# Patient Record
Sex: Female | Born: 1990 | Race: White | Hispanic: No | Marital: Married | Smoking: Never smoker
Health system: Southern US, Community
[De-identification: ages and names within clinical notes are randomized; demographics above are authoritative.]

## PROBLEM LIST (undated history)

## (undated) DIAGNOSIS — M1990 Unspecified osteoarthritis, unspecified site: Secondary | ICD-10-CM

## (undated) DIAGNOSIS — F419 Anxiety disorder, unspecified: Secondary | ICD-10-CM

## (undated) HISTORY — PX: WISDOM TOOTH EXTRACTION: SHX21

## (undated) HISTORY — DX: Unspecified osteoarthritis, unspecified site: M19.90

---
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
## 2008-09-09 ENCOUNTER — Emergency Department (HOSPITAL_COMMUNITY): Admission: EM | Admit: 2008-09-09 | Discharge: 2008-09-09 | Payer: Self-pay | Admitting: Emergency Medicine

## 2009-11-28 IMAGING — CR DG FOOT COMPLETE 3+V*R*
3 series · 3 of 3 positions shown · non-contrast
Comparison: None

CLINICAL DATA: Injured foot

RIGHT FOOT COMPLETE - 3+ VIEW

[t foot ap right]
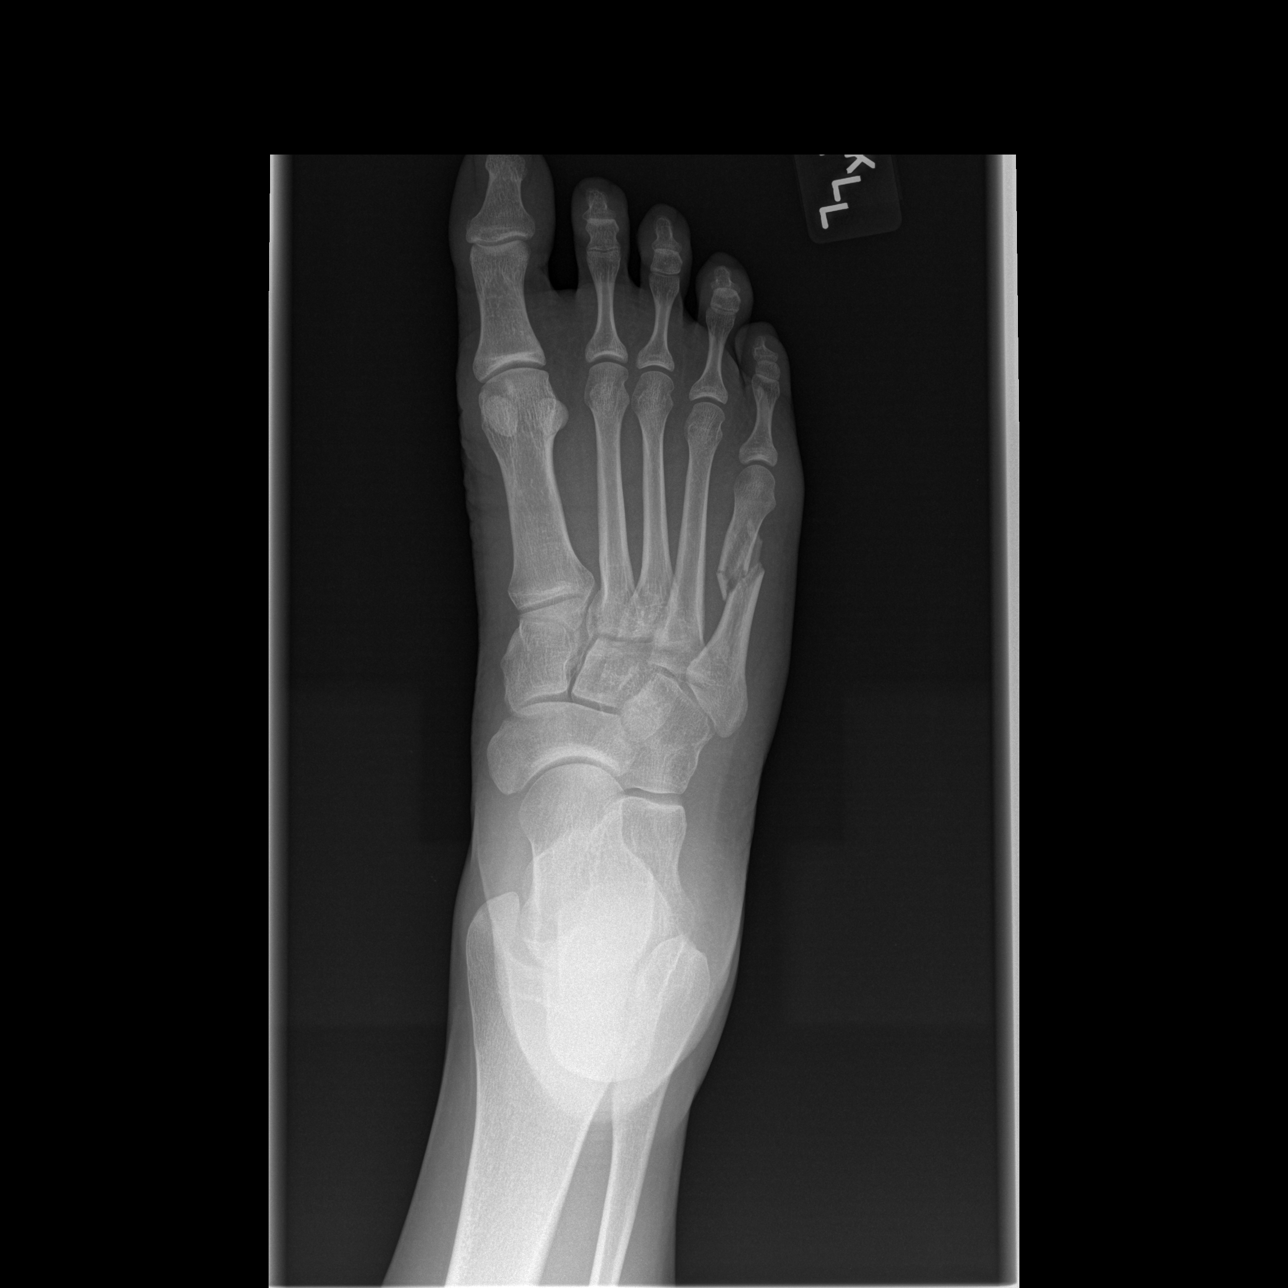

[t foot oblique right]
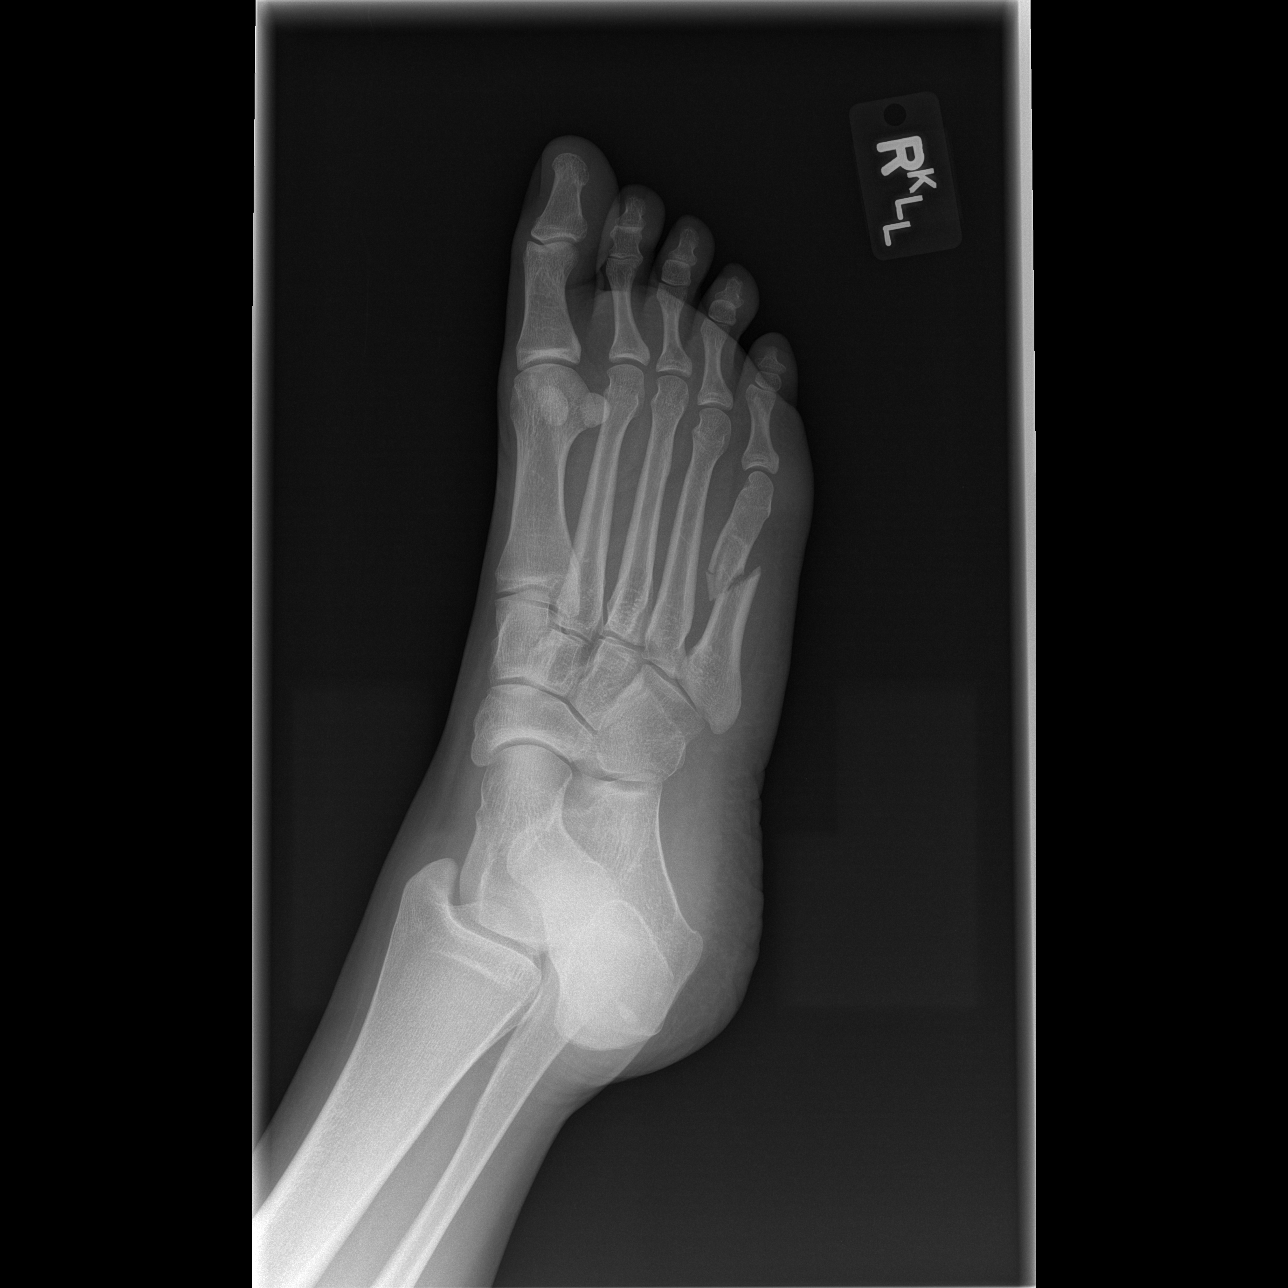

[t foot lat right]
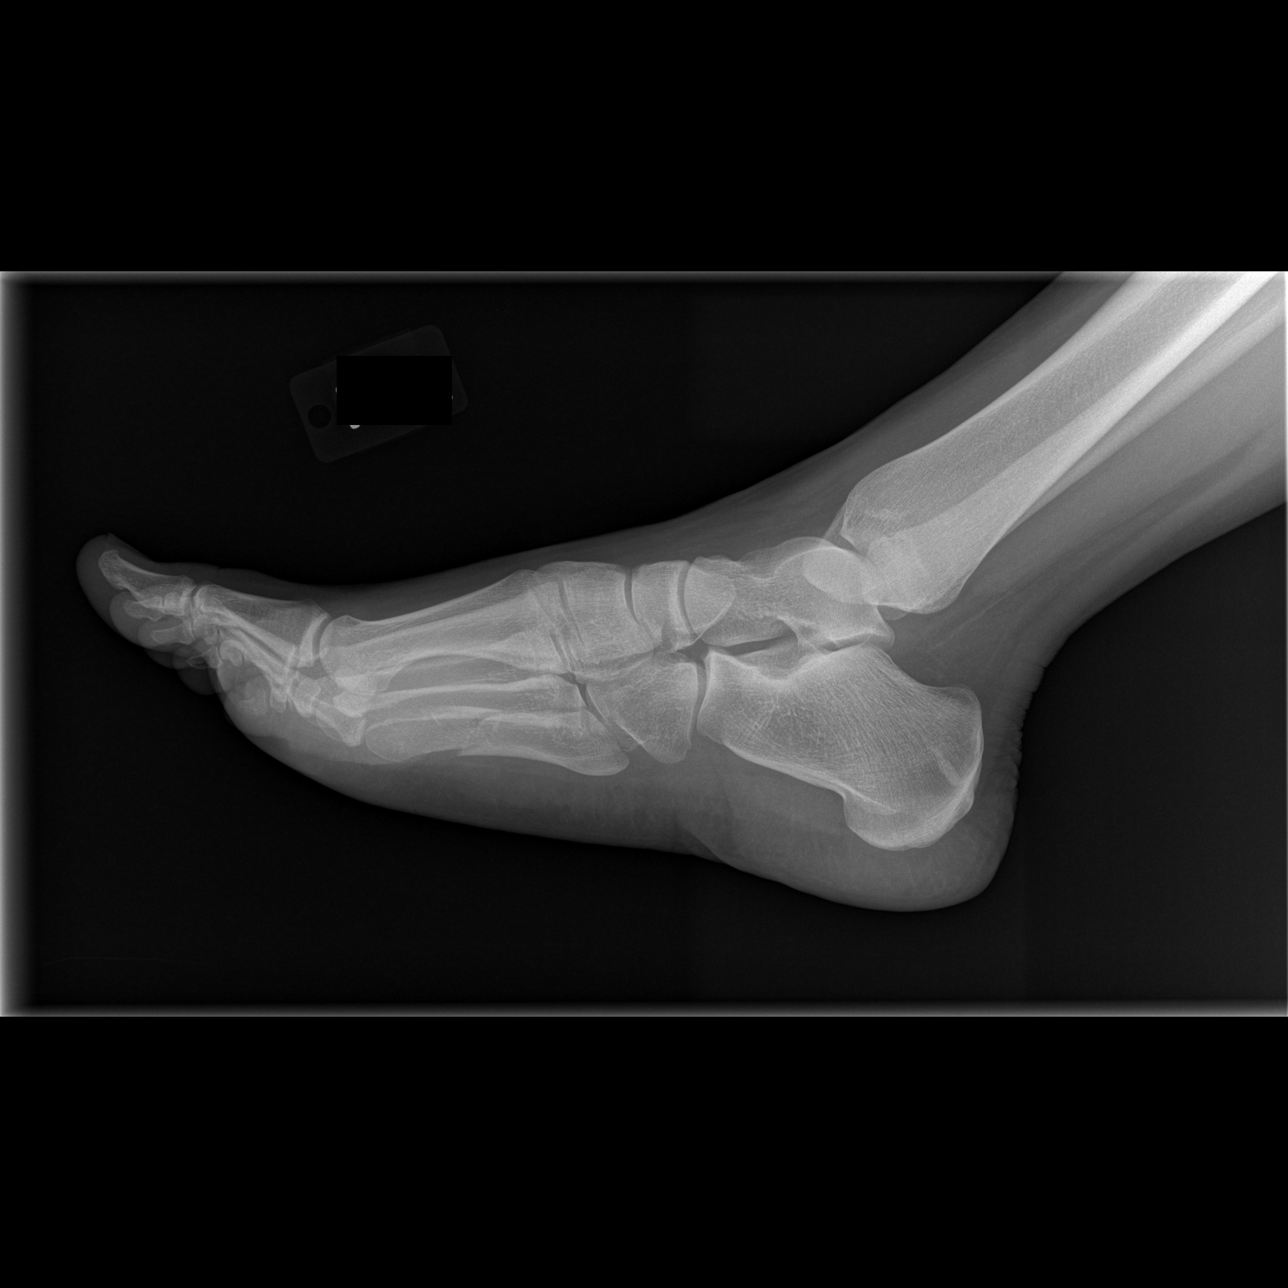

[3 of 3 positions shown; findings below may reference images not displayed]

FINDINGS: There is a displaced and somewhat comminuted fracture of
the fifth metatarsal shaft.  The joint spaces are maintained.  No
other fractures are seen.  There is a benign appearing sclerotic
area in the calcaneus.
IMPRESSION: 1.  Mid shaft fifth metatarsal fracture.

## 2012-05-11 ENCOUNTER — Ambulatory Visit: Payer: 59 | Admitting: Physician Assistant

## 2012-05-11 VITALS — BP 128/72 | HR 113 | Temp 101.8°F | Resp 20 | Ht 69.0 in | Wt 236.0 lb

## 2012-05-11 DIAGNOSIS — J029 Acute pharyngitis, unspecified: Secondary | ICD-10-CM

## 2012-05-11 DIAGNOSIS — M791 Myalgia, unspecified site: Secondary | ICD-10-CM

## 2012-05-11 DIAGNOSIS — R509 Fever, unspecified: Secondary | ICD-10-CM

## 2012-05-11 DIAGNOSIS — J209 Acute bronchitis, unspecified: Secondary | ICD-10-CM

## 2012-05-11 DIAGNOSIS — M069 Rheumatoid arthritis, unspecified: Secondary | ICD-10-CM | POA: Insufficient documentation

## 2012-05-11 LAB — POCT CBC
Granulocyte percent: 88.1 %G — AB (ref 37–80)
HCT, POC: 46.4 % (ref 37.7–47.9)
Lymph, poc: 1 (ref 0.6–3.4)
MCHC: 30.6 g/dL — AB (ref 31.8–35.4)
MCV: 97.1 fL — AB (ref 80–97)
POC LYMPH PERCENT: 7.5 %L — AB (ref 10–50)
RDW, POC: 14.3 %
WBC: 13.5 10*3/uL — AB (ref 4.6–10.2)

## 2012-05-11 LAB — POCT RAPID STREP A (OFFICE): Rapid Strep A Screen: NEGATIVE

## 2012-05-11 MED ORDER — HYDROCODONE-HOMATROPINE 5-1.5 MG/5ML PO SYRP: 5.0000 mL | ORAL_SOLUTION | Freq: Four times a day (QID) | ORAL | Status: AC | PRN

## 2012-05-11 MED ORDER — CEFDINIR 300 MG PO CAPS: 300.0000 mg | ORAL_CAPSULE | Freq: Two times a day (BID) | ORAL | Status: AC

## 2012-05-11 MED ORDER — IBUPROFEN 200 MG PO TABS
400.0000 mg | ORAL_TABLET | Freq: Once | ORAL | Status: AC
  Administered 2012-05-11: 400 mg via ORAL

## 2012-05-11 NOTE — Progress Notes (Signed)
  Subjective:    Patient ID: Eileen Landry, female    DOB: 08/30/1991, 21 y.o.   MRN: [id number]  HPI  Pt presents to clinic with 5 day h/o sore throat with congestion and productive cough with green phlegm.  The cough got worse today and she started to develop a fever today.  She started mucinex this am but unsure if it is helping.  Her cough is increased at night.  She has been around no sick people that she knows of.  She has RA and is immunocompromised due to methotrexate.  No h/o asthma.  Review of Systems  Constitutional: Positive for fever. Negative for chills.  HENT: Positive for congestion (green color), sore throat (getting slightly better in the afternoon), rhinorrhea and postnasal drip. Negative for ear pain.   Respiratory: Positive for cough (green). Negative for chest tightness.   Gastrointestinal: Negative for nausea, vomiting and diarrhea.       Objective:   Physical Exam  Nursing note and vitals reviewed. Constitutional: She is oriented to person, place, and time. She appears well-developed and well-nourished.  HENT:  Head: Normocephalic and atraumatic.  Right Ear: Hearing, tympanic membrane, external ear and ear canal normal.  Left Ear: Hearing, tympanic membrane, external ear and ear canal normal.  Nose: Mucosal edema (red and swollen) present.  Mouth/Throat: Oropharynx is clear and moist.  Eyes: Conjunctivae are normal.  Neck: Normal range of motion.  Cardiovascular: Normal rate, regular rhythm and normal heart sounds.   No murmur heard. Pulmonary/Chest: Effort normal and breath sounds normal. She has no wheezes.  Lymphadenopathy:    She has cervical adenopathy.  Neurological: She is alert and oriented to person, place, and time.  Skin: Skin is warm and dry. She is not diaphoretic.  Psychiatric: She has a normal mood and affect. Her behavior is normal. Judgment and thought content normal.    Results for orders placed in visit on 05/11/12  POCT RAPID STREP A  (OFFICE)      Component Value Range   Rapid Strep A Screen Negative  Negative  POCT CBC      Component Value Range   WBC 13.5 (*) 4.6 - 10.2 K/uL   Lymph, poc 1.0  0.6 - 3.4   POC LYMPH PERCENT 7.5 (*) 10 - 50 %L   MID (cbc) 0.6  0 - 0.9   POC MID % 4.4  0 - 12 %M   POC Granulocyte 11.9 (*) 2 - 6.9   Granulocyte percent 88.1 (*) 37 - 80 %G   RBC 4.78  4.04 - 5.48 M/uL   Hemoglobin 14.2  12.2 - 16.2 g/dL   HCT, POC 40.9  81.1 - 47.9 %   MCV 97.1 (*) 80 - 97 fL   MCH, POC 29.7  27 - 31.2 pg   MCHC 30.6 (*) 31.8 - 35.4 g/dL   RDW, POC 91.4     Platelet Count, POC 319  142 - 424 K/uL   MPV 7.5  0 - 99.8 fL         Assessment & Plan:   1. Fever  ibuprofen (ADVIL,MOTRIN) tablet 400 mg, POCT rapid strep A, POCT CBC, HYDROcodone-homatropine (HYCODAN) 5-1.5 MG/5ML syrup, cefdinir (OMNICEF) 300 MG capsule  2. Rheumatoid arthritis    3. Acute pharyngitis  POCT rapid strep A  4. Myalgia     Cover for bronchitis/sinusitis due to immunocompromised.  Push fluids.  Tylenol prn.

## 2012-05-12 ENCOUNTER — Ambulatory Visit (INDEPENDENT_AMBULATORY_CARE_PROVIDER_SITE_OTHER): Payer: 59 | Admitting: Emergency Medicine

## 2012-05-12 ENCOUNTER — Telehealth: Payer: Self-pay

## 2012-05-12 ENCOUNTER — Ambulatory Visit: Payer: 59

## 2012-05-12 VITALS — BP 120/75 | HR 94 | Temp 99.1°F | Resp 18 | Ht 68.5 in | Wt 237.0 lb

## 2012-05-12 DIAGNOSIS — M545 Low back pain, unspecified: Secondary | ICD-10-CM

## 2012-05-12 MED ORDER — HYDROCODONE-ACETAMINOPHEN 7.5-325 MG PO TABS: 1.0000 | ORAL_TABLET | Freq: Four times a day (QID) | ORAL | Status: AC | PRN

## 2012-05-12 MED ORDER — METAXALONE 800 MG PO TABS: 800.0000 mg | ORAL_TABLET | Freq: Three times a day (TID) | ORAL | Status: AC

## 2012-05-12 MED ORDER — DICLOFENAC SODIUM 75 MG PO TBEC: 75.0000 mg | DELAYED_RELEASE_TABLET | Freq: Two times a day (BID) | ORAL | Status: AC

## 2012-05-12 NOTE — Progress Notes (Signed)
  Subjective:    Patient ID: Eileen Landry, female    DOB: 1991/03/04, 21 y.o.   MRN: [id number]  HPI  Pt presents to clinic with acute back pain that started last PM after she reached down to pick up her medication from a low table.  She felt/heard a pop and had immediate pain.  She had/has no pain radiation and no weakness or paresthesias.  She is having trouble with movement because of the pain in her back.  She had some left over Vicodin that she took this am and it has not helped the pain.  She has never really had trouble with her back before except when she had started her new job and had pain in her upper back and she was started on Flexeril which did not help her at all.  Review of Systems  Musculoskeletal: Positive for back pain (lower) and gait problem.       Objective:   Physical Exam  Constitutional: She is oriented to person, place, and time. She appears well-developed and well-nourished.  HENT:  Head: Normocephalic and atraumatic.  Right Ear: External ear normal.  Left Ear: External ear normal.  Nose: Nose normal.  Pulmonary/Chest: Effort normal.  Musculoskeletal:       Lumbar back: She exhibits decreased range of motion (signifcatly reduced moevement 2nd to pain). She exhibits no tenderness, no bony tenderness and no spasm.       Back:  Neurological: She is alert and oriented to person, place, and time.  Skin: Skin is warm and dry.  Psychiatric: She has a normal mood and affect. Her behavior is normal. Judgment and thought content normal.   [facility] reading (PRIMARY) by  Dr. Cleta Alberts. Neg.          Assessment & Plan:   1. Acute lumbar back pain  DG Lumbar Spine 2-3 Views, HYDROcodone-acetaminophen (NORCO) 7.5-325 MG per tablet, metaxalone (SKELAXIN) 800 MG tablet, diclofenac (VOLTAREN) 75 MG EC tablet   Pt should use ice for the 48h then turn to heat.  Try stretches to help increase mobility.  D/w pt warning signs and when to rtc.

## 2012-05-12 NOTE — Telephone Encounter (Signed)
Pt saw S. Weber today and had questions re: her medications prescribed.

## 2012-05-14 NOTE — Telephone Encounter (Signed)
Spoke with patient, she states to disregard message now, as the medications are working better for her.

## 2012-07-03 ENCOUNTER — Ambulatory Visit (INDEPENDENT_AMBULATORY_CARE_PROVIDER_SITE_OTHER): Payer: 59 | Admitting: Emergency Medicine

## 2012-07-03 VITALS — BP 125/80 | HR 67 | Temp 98.7°F | Resp 16 | Ht 68.5 in | Wt 228.0 lb

## 2012-07-03 DIAGNOSIS — H01009 Unspecified blepharitis unspecified eye, unspecified eyelid: Secondary | ICD-10-CM

## 2012-07-03 MED ORDER — DOXYCYCLINE HYCLATE 100 MG PO TABS: 100.0000 mg | ORAL_TABLET | Freq: Two times a day (BID) | ORAL | Status: AC

## 2012-07-03 MED ORDER — LEVOFLOXACIN 500 MG PO TABS: 750.0000 mg | ORAL_TABLET | Freq: Every day | ORAL | Status: AC

## 2012-07-03 NOTE — Progress Notes (Signed)
   Date:  07/03/2012   Name:  Eileen Landry   DOB:  June 25, 1991   MRN:  [id number] Gender: female Age: 21 y.o.  PCP:  No primary provider on file.    Chief Complaint: Belepharitis   History of Present Illness:  Eileen Landry is a 21 y.o. pleasant patient who presents with the following:  Swollen and droopy left upper lid when she awoke yesterday morning.  No history of trauma or bites.  No fever or chills.  No drainage or gluing.  No pain in globe and no FB sensation. Says that socket and lid have intermittently been painful. Daily contact lens wearer but not wearing today.  No visual complaints.  Patient Active Problem List  Diagnosis  . Rheumatoid arthritis    No past medical history on file.  No past surgical history on file.  History  Substance Use Topics  . Smoking status: Never Smoker   . Smokeless tobacco: Not on file  . Alcohol Use: Not on file    No family history on file.  No Known Allergies  Medication list has been reviewed and updated.  Current Outpatient Prescriptions on File Prior to Visit  Medication Sig Dispense Refill  . folic acid (FOLVITE) 1 MG tablet Take 1 mg by mouth daily.      . medroxyPROGESTERone (DEPO-PROVERA) 150 MG/ML injection Inject 150 mg into the muscle every 3 (three) months.      . methotrexate (RHEUMATREX) 2.5 MG tablet Take 2.5 mg by mouth once a week. Caution:Chemotherapy. Protect from light.      . Multiple Vitamin (MULTIVITAMIN) tablet Take 1 tablet by mouth daily.      . diclofenac (VOLTAREN) 75 MG EC tablet Take 1 tablet (75 mg total) by mouth 2 (two) times daily.  30 tablet  0    Review of Systems:  As per HPI, otherwise negative.    Physical Examination: Filed Vitals:   07/03/12 1322  BP: 125/80  Pulse: 67  Temp: 98.7 F (37.1 C)  Resp: 16   Filed Vitals:   07/03/12 1322  Height: 5' 8.5" (1.74 m)  Weight: 228 lb (103.42 kg)   Body mass index is 34.16 kg/(m^2). Ideal Body Weight: Weight in (lb) to have BMI =  25: 166.5    GEN: WDWN, NAD, Non-toxic, Alert & Oriented x 3 HEENT: Atraumatic, Normocephalic.   Erythematous and swollen left upper lid.  No lesions, drainage.  Globe intact with no scleral injection, FB, or hyphema.  No chemosis. No drainage.  Oropharynx negative Ears and Nose: No external deformity.  TM negative Neck: supple no adenopathy EXTR: No clubbing/cyanosis/edema NEURO: Normal gait.  PSYCH: Normally interactive. Conversant. Not depressed or anxious appearing.  Calm demeanor.    Assessment and Plan: plepharitis Doxycycline levaquin Follow up as needed  Carmelina Dane, MD; Disc antib choices I have reviewed and agree with documentation. Robert P. Merla Riches, M.D.

## 2012-08-28 ENCOUNTER — Ambulatory Visit (INDEPENDENT_AMBULATORY_CARE_PROVIDER_SITE_OTHER): Payer: 59 | Admitting: Family Medicine

## 2012-08-28 VITALS — BP 118/82 | HR 81 | Temp 98.4°F | Resp 18 | Wt 226.0 lb

## 2012-08-28 DIAGNOSIS — H6120 Impacted cerumen, unspecified ear: Secondary | ICD-10-CM

## 2012-08-28 DIAGNOSIS — H9209 Otalgia, unspecified ear: Secondary | ICD-10-CM

## 2012-08-28 NOTE — Progress Notes (Signed)
21 year old woman with blocked ears for one week and progressive pain on the right.  Objective: Bilateral cerumen impaction which was lavaged clean Oropharynx: Normal Neck: Supple no adenopathy Assessment: Resolved cerumen impaction

## 2015-04-08 ENCOUNTER — Encounter (HOSPITAL_COMMUNITY): Payer: Self-pay | Admitting: *Deleted

## 2015-04-08 ENCOUNTER — Other Ambulatory Visit (HOSPITAL_COMMUNITY): Payer: Self-pay | Admitting: Orthopedic Surgery

## 2015-04-09 ENCOUNTER — Ambulatory Visit (HOSPITAL_COMMUNITY): Payer: 59

## 2015-04-09 ENCOUNTER — Ambulatory Visit (HOSPITAL_COMMUNITY): Payer: 59 | Admitting: Anesthesiology

## 2015-04-09 ENCOUNTER — Encounter (HOSPITAL_COMMUNITY)
Admission: RE | Disposition: A | Discharge status: Home / Self Care | Payer: Self-pay | Source: Ambulatory Visit | Attending: Orthopedic Surgery

## 2015-04-09 ENCOUNTER — Observation Stay (HOSPITAL_COMMUNITY): Payer: 59

## 2015-04-09 ENCOUNTER — Encounter (HOSPITAL_COMMUNITY): Payer: Self-pay | Admitting: *Deleted

## 2015-04-09 ENCOUNTER — Observation Stay (HOSPITAL_COMMUNITY)
Admission: RE | Admit: 2015-04-09 | Discharge: 2015-04-10 | Disposition: A | Discharge status: Home / Self Care | Payer: 59 | Source: Ambulatory Visit | Attending: Orthopedic Surgery | Admitting: Orthopedic Surgery

## 2015-04-09 DIAGNOSIS — Y9389 Activity, other specified: Secondary | ICD-10-CM | POA: Insufficient documentation

## 2015-04-09 DIAGNOSIS — F419 Anxiety disorder, unspecified: Secondary | ICD-10-CM | POA: Diagnosis not present

## 2015-04-09 DIAGNOSIS — W19XXXA Unspecified fall, initial encounter: Secondary | ICD-10-CM | POA: Insufficient documentation

## 2015-04-09 DIAGNOSIS — Y9289 Other specified places as the place of occurrence of the external cause: Secondary | ICD-10-CM | POA: Insufficient documentation

## 2015-04-09 DIAGNOSIS — Z881 Allergy status to other antibiotic agents status: Secondary | ICD-10-CM | POA: Diagnosis not present

## 2015-04-09 DIAGNOSIS — S82892A Other fracture of left lower leg, initial encounter for closed fracture: Secondary | ICD-10-CM

## 2015-04-09 DIAGNOSIS — Y998 Other external cause status: Secondary | ICD-10-CM | POA: Diagnosis not present

## 2015-04-09 DIAGNOSIS — S82842A Displaced bimalleolar fracture of left lower leg, initial encounter for closed fracture: Principal | ICD-10-CM | POA: Insufficient documentation

## 2015-04-09 DIAGNOSIS — M1990 Unspecified osteoarthritis, unspecified site: Secondary | ICD-10-CM | POA: Insufficient documentation

## 2015-04-09 DIAGNOSIS — S82899A Other fracture of unspecified lower leg, initial encounter for closed fracture: Secondary | ICD-10-CM | POA: Diagnosis present

## 2015-04-09 HISTORY — DX: Anxiety disorder, unspecified: F41.9

## 2015-04-09 HISTORY — PX: ORIF ANKLE FRACTURE: SHX5408

## 2015-04-09 LAB — CBC
HEMATOCRIT: 41.1 % (ref 36.0–46.0)
Hemoglobin: 13.5 g/dL (ref 12.0–15.0)
MCH: 29.9 pg (ref 26.0–34.0)
MCHC: 32.8 g/dL (ref 30.0–36.0)
MCV: 90.9 fL (ref 78.0–100.0)
Platelets: 270 10*3/uL (ref 150–400)
RBC: 4.52 MIL/uL (ref 3.87–5.11)
RDW: 15.1 % (ref 11.5–15.5)
WBC: 11.7 10*3/uL — AB (ref 4.0–10.5)

## 2015-04-09 LAB — HCG, SERUM, QUALITATIVE: Preg, Serum: NEGATIVE

## 2015-04-09 SURGERY — OPEN REDUCTION INTERNAL FIXATION (ORIF) ANKLE FRACTURE
Anesthesia: General | Site: Ankle | Laterality: Left

## 2015-04-09 MED ORDER — METOCLOPRAMIDE HCL 5 MG PO TABS: 5.0000 mg | ORAL_TABLET | Freq: Three times a day (TID) | ORAL | Status: DC | PRN

## 2015-04-09 MED ORDER — FENTANYL CITRATE (PF) 250 MCG/5ML IJ SOLN
INTRAMUSCULAR | Status: AC
  Filled 2015-04-09: qty 5

## 2015-04-09 MED ORDER — CLINDAMYCIN PHOSPHATE 900 MG/50ML IV SOLN
900.0000 mg | INTRAVENOUS | Status: AC
  Administered 2015-04-09: 900 mg via INTRAVENOUS
  Filled 2015-04-09: qty 50

## 2015-04-09 MED ORDER — FENTANYL CITRATE (PF) 100 MCG/2ML IJ SOLN
100.0000 ug | Freq: Once | INTRAMUSCULAR | Status: AC
  Administered 2015-04-09: 100 ug via INTRAVENOUS

## 2015-04-09 MED ORDER — METHOCARBAMOL 500 MG PO TABS
500.0000 mg | ORAL_TABLET | Freq: Three times a day (TID) | ORAL | Status: DC | PRN
  Administered 2015-04-09 – 2015-04-10 (×3): 500 mg via ORAL
  Filled 2015-04-09 (×4): qty 1

## 2015-04-09 MED ORDER — FENTANYL CITRATE (PF) 100 MCG/2ML IJ SOLN
INTRAMUSCULAR | Status: AC
  Administered 2015-04-09: 100 ug via INTRAVENOUS
  Filled 2015-04-09: qty 2

## 2015-04-09 MED ORDER — LIDOCAINE HCL (CARDIAC) 20 MG/ML IV SOLN
INTRAVENOUS | Status: DC | PRN
  Administered 2015-04-09: 25 mg via INTRAVENOUS

## 2015-04-09 MED ORDER — CLINDAMYCIN PHOSPHATE 600 MG/50ML IV SOLN
600.0000 mg | Freq: Four times a day (QID) | INTRAVENOUS | Status: AC
  Administered 2015-04-10 (×2): 600 mg via INTRAVENOUS
  Filled 2015-04-09 (×3): qty 50

## 2015-04-09 MED ORDER — ONDANSETRON HCL 4 MG/2ML IJ SOLN
INTRAMUSCULAR | Status: DC | PRN
  Administered 2015-04-09: 4 mg via INTRAVENOUS

## 2015-04-09 MED ORDER — FENTANYL CITRATE (PF) 100 MCG/2ML IJ SOLN
INTRAMUSCULAR | Status: DC | PRN
  Administered 2015-04-09 (×10): 50 ug via INTRAVENOUS

## 2015-04-09 MED ORDER — PROPOFOL 10 MG/ML IV BOLUS
INTRAVENOUS | Status: AC
  Filled 2015-04-09: qty 20

## 2015-04-09 MED ORDER — ADULT MULTIVITAMIN W/MINERALS CH
1.0000 | ORAL_TABLET | Freq: Every day | ORAL | Status: DC
  Administered 2015-04-10: 1 via ORAL
  Filled 2015-04-09: qty 1

## 2015-04-09 MED ORDER — OXYCODONE HCL 5 MG PO TABS
ORAL_TABLET | ORAL | Status: AC
  Filled 2015-04-09: qty 1

## 2015-04-09 MED ORDER — PROPOFOL 10 MG/ML IV BOLUS
INTRAVENOUS | Status: DC | PRN
  Administered 2015-04-09: 200 mg via INTRAVENOUS

## 2015-04-09 MED ORDER — ONDANSETRON HCL 4 MG/2ML IJ SOLN: 4.0000 mg | Freq: Once | INTRAMUSCULAR | Status: DC | PRN

## 2015-04-09 MED ORDER — MIDAZOLAM HCL 2 MG/2ML IJ SOLN
INTRAMUSCULAR | Status: AC
  Administered 2015-04-09: 2 mg via INTRAVENOUS
  Filled 2015-04-09: qty 2

## 2015-04-09 MED ORDER — ACETAMINOPHEN 325 MG PO TABS: 650.0000 mg | ORAL_TABLET | Freq: Four times a day (QID) | ORAL | Status: DC | PRN

## 2015-04-09 MED ORDER — RIVAROXABAN 10 MG PO TABS
10.0000 mg | ORAL_TABLET | Freq: Every day | ORAL | Status: DC
  Administered 2015-04-10: 10 mg via ORAL
  Filled 2015-04-09: qty 1

## 2015-04-09 MED ORDER — FENTANYL CITRATE (PF) 100 MCG/2ML IJ SOLN
25.0000 ug | INTRAMUSCULAR | Status: DC | PRN
  Administered 2015-04-09: 25 ug via INTRAVENOUS
  Administered 2015-04-09 (×2): 50 ug via INTRAVENOUS
  Administered 2015-04-09: 25 ug via INTRAVENOUS

## 2015-04-09 MED ORDER — ONDANSETRON HCL 4 MG PO TABS: 4.0000 mg | ORAL_TABLET | Freq: Four times a day (QID) | ORAL | Status: DC | PRN

## 2015-04-09 MED ORDER — FENTANYL CITRATE (PF) 100 MCG/2ML IJ SOLN
INTRAMUSCULAR | Status: AC
  Filled 2015-04-09: qty 2

## 2015-04-09 MED ORDER — MIDAZOLAM HCL 2 MG/2ML IJ SOLN
2.0000 mg | Freq: Once | INTRAMUSCULAR | Status: AC
  Administered 2015-04-09: 2 mg via INTRAVENOUS

## 2015-04-09 MED ORDER — OXYCODONE HCL 5 MG PO TABS
5.0000 mg | ORAL_TABLET | ORAL | Status: DC | PRN
Start: 2015-04-09 — End: 2015-04-10
  Administered 2015-04-10 (×5): 10 mg via ORAL
  Filled 2015-04-09 (×5): qty 2

## 2015-04-09 MED ORDER — MIDAZOLAM HCL 2 MG/2ML IJ SOLN
INTRAMUSCULAR | Status: AC
  Filled 2015-04-09: qty 2

## 2015-04-09 MED ORDER — OXYCODONE HCL 5 MG/5ML PO SOLN: 5.0000 mg | Freq: Once | ORAL | Status: AC | PRN

## 2015-04-09 MED ORDER — METHOCARBAMOL 500 MG PO TABS
ORAL_TABLET | ORAL | Status: AC
  Filled 2015-04-09: qty 1

## 2015-04-09 MED ORDER — LACTATED RINGERS IV SOLN
INTRAVENOUS | Status: DC
  Administered 2015-04-09 (×2): via INTRAVENOUS

## 2015-04-09 MED ORDER — 0.9 % SODIUM CHLORIDE (POUR BTL) OPTIME
TOPICAL | Status: DC | PRN
  Administered 2015-04-09: 1000 mL

## 2015-04-09 MED ORDER — CLONAZEPAM 1 MG PO TABS
1.0000 mg | ORAL_TABLET | Freq: Three times a day (TID) | ORAL | Status: DC | PRN
  Administered 2015-04-10: 1 mg via ORAL
  Filled 2015-04-09: qty 1

## 2015-04-09 MED ORDER — HYDROMORPHONE HCL 1 MG/ML IJ SOLN
1.0000 mg | INTRAMUSCULAR | Status: DC | PRN
  Administered 2015-04-09 – 2015-04-10 (×3): 1 mg via INTRAVENOUS
  Filled 2015-04-09 (×3): qty 1

## 2015-04-09 MED ORDER — ACETAMINOPHEN 650 MG RE SUPP: 650.0000 mg | Freq: Four times a day (QID) | RECTAL | Status: DC | PRN

## 2015-04-09 MED ORDER — ONDANSETRON HCL 4 MG/2ML IJ SOLN: 4.0000 mg | Freq: Four times a day (QID) | INTRAMUSCULAR | Status: DC | PRN

## 2015-04-09 MED ORDER — OXYCODONE HCL 5 MG PO TABS
5.0000 mg | ORAL_TABLET | Freq: Once | ORAL | Status: AC | PRN
  Administered 2015-04-09: 5 mg via ORAL

## 2015-04-09 MED ORDER — PHENYLEPHRINE HCL 10 MG/ML IJ SOLN
INTRAMUSCULAR | Status: DC | PRN
  Administered 2015-04-09: 80 ug via INTRAVENOUS
  Administered 2015-04-09: 40 ug via INTRAVENOUS
  Administered 2015-04-09: 80 ug via INTRAVENOUS

## 2015-04-09 MED ORDER — METOCLOPRAMIDE HCL 5 MG/ML IJ SOLN: 5.0000 mg | Freq: Three times a day (TID) | INTRAMUSCULAR | Status: DC | PRN

## 2015-04-09 SURGICAL SUPPLY — 72 items
BANDAGE ELASTIC 4 VELCRO ST LF (GAUZE/BANDAGES/DRESSINGS) ×3 IMPLANT
BANDAGE ELASTIC 6 VELCRO ST LF (GAUZE/BANDAGES/DRESSINGS) ×3 IMPLANT
BIT DRILL 2.7 QC CANN 155 (BIT) ×2 IMPLANT
BIT DRILL 2.7 QC CANN 155MM (BIT) ×1
BIT DRILL 3.5 QC 155 (BIT) ×2 IMPLANT
BIT DRILL 3.5 QC 155MM (BIT) ×1
BIT DRILL QC 2.7 6.3IN  SHORT (BIT) ×2
BIT DRILL QC 2.7 6.3IN SHORT (BIT) ×1 IMPLANT
BLADE SURG 10 STRL SS (BLADE) IMPLANT
BNDG COHESIVE 6X5 TAN STRL LF (GAUZE/BANDAGES/DRESSINGS) IMPLANT
BNDG ESMARK 4X9 LF (GAUZE/BANDAGES/DRESSINGS) ×3 IMPLANT
BNDG GAUZE ELAST 4 BULKY (GAUZE/BANDAGES/DRESSINGS) ×3 IMPLANT
COVER MAYO STAND STRL (DRAPES) ×3 IMPLANT
COVER SURGICAL LIGHT HANDLE (MISCELLANEOUS) ×3 IMPLANT
CUFF TOURNIQUET SINGLE 34IN LL (TOURNIQUET CUFF) IMPLANT
CUFF TOURNIQUET SINGLE 44IN (TOURNIQUET CUFF) IMPLANT
DRAPE C-ARM 42X72 X-RAY (DRAPES) ×3 IMPLANT
DRAPE INCISE IOBAN 66X45 STRL (DRAPES) ×3 IMPLANT
DRAPE SURG 17X23 STRL (DRAPES) ×3 IMPLANT
DRAPE U-SHAPE 47X51 STRL (DRAPES) ×3 IMPLANT
DRSG PAD ABDOMINAL 8X10 ST (GAUZE/BANDAGES/DRESSINGS) ×3 IMPLANT
DURAPREP 26ML APPLICATOR (WOUND CARE) ×6 IMPLANT
ELECT REM PT RETURN 9FT ADLT (ELECTROSURGICAL) ×3
ELECTRODE REM PT RTRN 9FT ADLT (ELECTROSURGICAL) ×1 IMPLANT
FACESHIELD WRAPAROUND (MASK) ×3 IMPLANT
GAUZE SPONGE 4X4 12PLY STRL (GAUZE/BANDAGES/DRESSINGS) ×3 IMPLANT
GAUZE XEROFORM 5X9 LF (GAUZE/BANDAGES/DRESSINGS) ×3 IMPLANT
GLOVE BIOGEL PI IND STRL 8 (GLOVE) ×2 IMPLANT
GLOVE BIOGEL PI INDICATOR 8 (GLOVE) ×4
GLOVE SURG ORTHO 8.0 STRL STRW (GLOVE) ×6 IMPLANT
GOWN STRL REUS W/ TWL LRG LVL3 (GOWN DISPOSABLE) ×3 IMPLANT
GOWN STRL REUS W/TWL LRG LVL3 (GOWN DISPOSABLE) ×6
GUIDE PIN 1.3 (Pin) ×12 IMPLANT
HANDPIECE INTERPULSE COAX TIP (DISPOSABLE)
KIT BASIN OR (CUSTOM PROCEDURE TRAY) ×3 IMPLANT
KIT ROOM TURNOVER OR (KITS) ×3 IMPLANT
MANIFOLD NEPTUNE II (INSTRUMENTS) ×3 IMPLANT
NEEDLE HYPO 25GX1X1/2 BEV (NEEDLE) IMPLANT
NS IRRIG 1000ML POUR BTL (IV SOLUTION) ×3 IMPLANT
PACK ORTHO EXTREMITY (CUSTOM PROCEDURE TRAY) ×3 IMPLANT
PAD ARMBOARD 7.5X6 YLW CONV (MISCELLANEOUS) ×6 IMPLANT
PAD CAST 4YDX4 CTTN HI CHSV (CAST SUPPLIES) ×1 IMPLANT
PADDING CAST COTTON 4X4 STRL (CAST SUPPLIES) ×2
PADDING CAST COTTON 6X4 STRL (CAST SUPPLIES) ×3 IMPLANT
PIN GUIDE 1.3 (Pin) ×4 IMPLANT
PLATE FIBULA DISTAL 5 HOLE (Plate) ×3 IMPLANT
SCREW CANCELLOUS 5.0X16MM (Screw) ×3 IMPLANT
SCREW CANNULATED 4.1X40 (Screw) ×3 IMPLANT
SCREW LOCK 10X3.5XST NS (Screw) ×1 IMPLANT
SCREW LOCK 12X3.5XST PRLC (Screw) ×2 IMPLANT
SCREW LOCK 3.5X10 (Screw) ×2 IMPLANT
SCREW LOCK 3.5X12 (Screw) ×4 IMPLANT
SCREW NL 3.5X14 (Screw) ×3 IMPLANT
SCREW NON LOCK 3.5X12 (Screw) ×6 IMPLANT
SCREW NON LOCK 3.5X20 (Screw) ×3 IMPLANT
SCREW NONLOCK 3.5X10 (Screw) ×3 IMPLANT
SET HNDPC FAN SPRY TIP SCT (DISPOSABLE) IMPLANT
SPLINT PLASTER CAST XFAST 5X30 (CAST SUPPLIES) ×1 IMPLANT
SPLINT PLASTER XFAST SET 5X30 (CAST SUPPLIES) ×2
STOCKINETTE IMPERVIOUS 9X36 MD (GAUZE/BANDAGES/DRESSINGS) ×3 IMPLANT
SUCTION FRAZIER TIP 10 FR DISP (SUCTIONS) ×3 IMPLANT
SUT ETHILON 3 0 PS 1 (SUTURE) ×12 IMPLANT
SUT VIC AB 2-0 CTB1 (SUTURE) ×6 IMPLANT
SUT VIC AB 3-0 SH 27 (SUTURE) ×4
SUT VIC AB 3-0 SH 27X BRD (SUTURE) ×2 IMPLANT
SYR CONTROL 10ML LL (SYRINGE) IMPLANT
TOWEL OR 17X24 6PK STRL BLUE (TOWEL DISPOSABLE) ×3 IMPLANT
TOWEL OR 17X26 10 PK STRL BLUE (TOWEL DISPOSABLE) ×3 IMPLANT
TUBE CONNECTING 12'X1/4 (SUCTIONS) ×1
TUBE CONNECTING 12X1/4 (SUCTIONS) ×2 IMPLANT
WATER STERILE IRR 1000ML POUR (IV SOLUTION) ×3 IMPLANT
YANKAUER SUCT BULB TIP NO VENT (SUCTIONS) ×3 IMPLANT

## 2015-04-09 NOTE — Transfer of Care (Signed)
Immediate Anesthesia Transfer of Care Note  Patient: Eileen Landry  Procedure(s) Performed: Procedure(s): OPEN REDUCTION INTERNAL FIXATION LEFT ANKLE FRACTURE, BIMALLEOLAR.  (Left)  Patient Location: PACU  Anesthesia Type:General with regional   Level of Consciousness: awake, alert , oriented and patient cooperative  Airway & Oxygen Therapy: Patient Spontanous Breathing and Patient connected to nasal cannula oxygen  Post-op Assessment: Report given to RN, Post -op Vital signs reviewed and stable, Patient moving all extremities and Patient moving all extremities X 4  Post vital signs: Reviewed and stable  Last Vitals:  Filed Vitals:   04/09/15 2200  BP: 134/105  Pulse: 75  Temp: 36.7 C  Resp: 15    Complications: No apparent anesthesia complications

## 2015-04-09 NOTE — Brief Op Note (Signed)
04/09/2015  9:56 PM  PATIENT:  Eileen Landry  24 y.o. female  PRE-OPERATIVE DIAGNOSIS:  LEFT BIMALLEOLAR ANKLE FRACTURE  POST-OPERATIVE DIAGNOSIS:  LEFT BIMALLEOLAR ANKLE FRACTURE  PROCEDURE:  Procedure(s): OPEN REDUCTION INTERNAL FIXATION LEFT ANKLE FRACTURE, BIMALLEOLAR.   SURGEON:  Surgeon(s): Cammy Copa, MD  ASSISTANT: carla bethune rnfa  ANESTHESIA:   general  EBL: 75 ml    Total I/O In: 1000 [I.V.:1000] Out: -   BLOOD ADMINISTERED: none  DRAINS: none   LOCAL MEDICATIONS USED:  none  SPECIMEN:  No Specimen  COUNTS:  YES  TOURNIQUET:  * No tourniquets in log *  DICTATION: .Other Dictation: Dictation Number [id number]  PLAN OF CARE: Discharge to home after PACU  PATIENT DISPOSITION:  PACU - hemodynamically stable

## 2015-04-09 NOTE — Anesthesia Preprocedure Evaluation (Addendum)
Anesthesia Evaluation  Patient identified by MRN, date of birth, ID band Patient awake    Reviewed: Allergy & Precautions, NPO status , Patient's Chart, lab work & pertinent test results  Airway Mallampati: II  TM Distance: >3 FB Neck ROM: Full    Dental  (+) Teeth Intact, Dental Advisory Given   Pulmonary  breath sounds clear to auscultation        Cardiovascular Rhythm:Regular Rate:Normal     Neuro/Psych    GI/Hepatic   Endo/Other    Renal/GU      Musculoskeletal   Abdominal (+) + obese,   Peds  Hematology   Anesthesia Other Findings   Reproductive/Obstetrics                             Anesthesia Physical Anesthesia Plan  ASA: II  Anesthesia Plan: General   Post-op Pain Management:    Induction: Intravenous  Airway Management Planned: LMA  Additional Equipment:   Intra-op Plan:   Post-operative Plan:   Informed Consent: I have reviewed the patients History and Physical, chart, labs and discussed the procedure including the risks, benefits and alternatives for the proposed anesthesia with the patient or authorized representative who has indicated his/her understanding and acceptance.   Dental advisory given  Plan Discussed with: CRNA and Anesthesiologist  Anesthesia Plan Comments: (Plan GA with LMA and popliteal block)        Anesthesia Quick Evaluation

## 2015-04-09 NOTE — Anesthesia Postprocedure Evaluation (Signed)
  Anesthesia Post-op Note  Patient: Eileen Landry  Procedure(s) Performed: Procedure(s): OPEN REDUCTION INTERNAL FIXATION LEFT ANKLE FRACTURE, BIMALLEOLAR.  (Left)  Patient Location: PACU  Anesthesia Type:GA combined with regional for post-op pain  Level of Consciousness: awake, alert , oriented and patient cooperative  Airway and Oxygen Therapy: Patient Spontanous Breathing and Patient connected to nasal cannula oxygen  Post-op Pain: mild  Post-op Assessment: Post-op Vital signs reviewed, Patient's Cardiovascular Status Stable, Respiratory Function Stable, No signs of Nausea or vomiting and Pain level controlled LLE Motor Response: Purposeful movement (able to wiggle toes) LLE Sensation: Numbness (due to block)          Post-op Vital Signs: Reviewed and stable  Last Vitals:  Filed Vitals:   04/09/15 2315  BP: 128/61  Pulse: 81  Temp: 37.1 C  Resp: 12    Complications: No apparent anesthesia complications

## 2015-04-09 NOTE — Anesthesia Procedure Notes (Addendum)
Anesthesia Regional Block:  Popliteal block  Pre-Anesthetic Checklist: ,, timeout performed, Correct Patient, Correct Site, Correct Laterality, Correct Procedure, Correct Position, site marked, Risks and benefits discussed,  Surgical consent,  Pre-op evaluation,  At surgeon's request and post-op pain management  Laterality: Left  Prep: chloraprep       Needles:  Injection technique: Single-shot  Needle Type: Echogenic Stimulator Needle     Needle Length: 9cm 9 cm Needle Gauge: 22 and 22 G    Additional Needles:  Procedures: ultrasound guided (picture in chart) and nerve stimulator Popliteal block Narrative:  Start time: 04/09/2015 5:45 PM End time: 04/09/2015 5:50 PM Injection made incrementally with aspirations every 5 mL.  Performed by: Personally   Additional Notes: 30 cc 0.5% bupivacaine with 1:200 epi injected easily   Procedure Name: LMA Insertion Date/Time: 04/09/2015 7:39 PM Performed by: Adonis Housekeeper Pre-anesthesia Checklist: Patient identified, Emergency Drugs available, Suction available and Patient being monitored Patient Re-evaluated:Patient Re-evaluated prior to inductionOxygen Delivery Method: Circle system utilized Preoxygenation: Pre-oxygenation with 100% oxygen Intubation Type: IV induction Ventilation: Mask ventilation without difficulty LMA Size: 4.0 Number of attempts: 1 Placement Confirmation: positive ETCO2 and breath sounds checked- equal and bilateral Tube secured with: Tape Dental Injury: Teeth and Oropharynx as per pre-operative assessment

## 2015-04-09 NOTE — Progress Notes (Signed)
Report to C Moseley RN as primary caregiver 

## 2015-04-09 NOTE — H&P (Signed)
Eileen Landry is an 24 y.o. female.   Chief Complaint: Left ankle pain HPI: Eileen Landry is a 25 year old female with left ankle pain. She sustained an injury several days ago while she was on honeymoon in [place] country "she reports pain with"bimalleolar ankle fracture. There is no calf tenderness. No family history of DVT or pulmonary embolism.  Past Medical History  Diagnosis Date  . Arthritis   . Anxiety     Past Surgical History  Procedure Laterality Date  . Fracture surgery Right 2009    foot  . Wisdom tooth extraction      History reviewed. No pertinent family history. Social History:  reports that she has never smoked. She does not have any smokeless tobacco history on file. She reports that she drinks about 1.2 oz of alcohol per week. She reports that she does not use illicit drugs.  Allergies:  Allergies  Allergen Reactions  . Amoxicillin Rash    No prescriptions prior to admission    No results found for this or any previous visit (from the past 48 hour(s)). No results found.  Review of Systems  Constitutional: Negative.   HENT: Negative.   Eyes: Negative.   Respiratory: Negative.   Cardiovascular: Negative.   Gastrointestinal: Negative.   Genitourinary: Negative.   Musculoskeletal: Positive for joint pain.  Skin: Negative.   Neurological: Negative.   Endo/Heme/Allergies: Negative.   Psychiatric/Behavioral: Negative.     There were no vitals taken for this visit. Physical Exam  Constitutional: She appears well-developed.  HENT:  Head: Normocephalic.  Eyes: Pupils are equal, round, and reactive to light.  Neck: Normal range of motion.  Cardiovascular: Normal rate.   Respiratory: Effort normal.  Neurological: She is alert.  Skin: Skin is warm.  Psychiatric: She has a normal mood and affect.   left ankle demonstrates swelling medially and laterally palpable pedal pulse intact sensation dorsum plantar aspect of the foot she has no calf  tenderness no knee effusion no groin pain with internal extra rotation of the left hand side   Assessment/Plan Impression is bimalleolar ankle fracture on the left plan open reduction internal fixation risk and benefits discussed with the patient including but limited to infection or vessel damage need for hardware removal. Plan rehabilitative schedule also discussed with the patient. Patient understands the risk and benefits all questions answered plan for surgery as an outpatient tomorrow. Pain prescriptions and anticoagulation prescriptions given in the clinic  DEAN,GREGORY SCOTT 04/09/2015, 10:14 AM

## 2015-04-10 ENCOUNTER — Encounter (HOSPITAL_COMMUNITY): Payer: Self-pay | Admitting: General Practice

## 2015-04-10 DIAGNOSIS — S82842A Displaced bimalleolar fracture of left lower leg, initial encounter for closed fracture: Secondary | ICD-10-CM | POA: Diagnosis not present

## 2015-04-10 NOTE — Op Note (Signed)
NAMEMarland Kitchen  Eileen, Landry NO.:  [id number]  MEDICAL RECORD NO.:  [id number]  LOCATION:  5N10C                        FACILITY:  [facility]  PHYSICIAN:  Burnard Bunting, M.D.    DATE OF BIRTH:  23-Jan-1991  DATE OF PROCEDURE: DATE OF DISCHARGE:  04/10/2015                              OPERATIVE REPORT   PREOPERATIVE DIAGNOSIS:  Left trimalleolar ankle fracture.  POSTOPERATIVE DIAGNOSIS:  Left trimalleolar ankle fracture.  PROCEDURE:  Left bimalleolar ankle fracture open reduction and internal fixation.  SURGEON:  Burnard Bunting, M.D.  ASSISTANT:  Patrick Jupiter, RNFA.  INDICATIONS:  Eileen Landry is a 25 year old patient with left ankle fracture presents for operative management after explanation of risks and benefits of fixing the unstable ankle fracture.  PROCEDURE IN DETAIL:  The patient was brought to operating room where general anesthetic was induced.  Preoperative antibiotics were administered.  Time-out was called.  Left leg was prescrubbed with alcohol and Betadine, then allowed to dry.  Prepped with DuraPrep solution and draped in sterile manner.  Time-out was called.  Preop antibiotics were given.  Collier Flowers was used to cover the operative field. Ankle Esmarch utilized for approximately an hour and 15 minutes. Lateral approach to the ankle was made.  Skin and subcutaneous tissue were sharply divided.  Care was taken to avoid injury to superficial peroneal nerve.  Fracture was identified and periosteum elevated from around the fracture. Fracture was reduced and held with a tenaculum. Lag screw placed anterior proximal to posterior distal.  A 5 hole plate then applied Smith and Nephew.  Good reduction was achieved.  Correct location confirmed in the AP and lateral planes under fluoroscopy. Attention was then directed medially.  Fracture location was localized under fluoroscopy.  Incision made.  Care was taken to avoid injury to saphenous nerve and vein.  The  fracture was a small piece.  It was only big enough to accept 1 screw.  The fracture was somewhat difficult to reduce because of rotational instability.  Namelessness, fracture was reduced anatomically and held in position with 2 K-wires, 1 of which was used to facilitate placement of a cannulated 4-0 screw.  This gave good reduction and syndesmotic stability was then tested.  With the ankle plantar flexed, there was more medial and lateral instability, however, she did have a prior ankle injury with ossicle formation.  With the ankle dorsiflexed, there was no syndesmotic instability.  At this time, ankle Esmarch released and thorough irrigation performed of both incisions. Both incisions were then closed using either 2-0 or 3-0 Vicryl and 3-0 nylon. A well-padded posterior splint applied. The patient tolerated the procedure without immediate complication.     Burnard Bunting, M.D.     GSD/MEDQ  D:  04/09/2015  T:  04/10/2015  Job:  [id number]

## 2015-04-10 NOTE — Progress Notes (Signed)
Subjective: Pt stable but having a lot of pain   Objective: Vital signs in last 24 hours: Temp:  [98.1 F (36.7 C)-99.1 F (37.3 C)] 99.1 F (37.3 C) (06/15 0610) Pulse Rate:  [55-102] 96 (06/15 0610) Resp:  [10-32] 14 (06/15 0610) BP: (115-139)/(54-105) 120/54 mmHg (06/15 0610) SpO2:  [95 %-100 %] 95 % (06/15 0610) Weight:  [108.863 kg (240 lb)] 108.863 kg (240 lb) (06/14 1522)  Intake/Output from previous day: 06/14 0701 - 06/15 0700 In: 1910 [P.O.:360; I.V.:1500; IV Piggyback:50] Out: 50 [Blood:50] Intake/Output this shift:    Exam:  toe df pf ok - still with dorsal paresthesias - foot perfused left  Labs:  Recent Labs  04/09/15 1523  HGB 13.5    Recent Labs  04/09/15 1523  WBC 11.7*  RBC 4.52  HCT 41.1  PLT 270   No results for input(s): NA, K, CL, CO2, BUN, CREATININE, GLUCOSE, CALCIUM in the last 72 hours. No results for input(s): LABPT, INR in the last 72 hours.  Assessment/Plan: She has not been up today - will need PT eval and possibly one more night in hospital - for pain control - toe color wnl   Nathalie Cavendish SCOTT 04/10/2015, 7:45 AM

## 2015-04-10 NOTE — Progress Notes (Signed)
Physical Therapy Treatment Patient Details Name: Eileen Landry MRN: [id number] DOB: [birth year] Today's Date: 04/10/2015    History of Present Illness pt presents with L ankle fx s/p ORIF.    PT Comments    Pt demonstrated ability to ambulate 80 ft and ascend/descend 2 steps this session.  Pt required demonstration and VCs for stair training technique.  Pt is making good progress w/ PT.  Pt will benefit from continued skilled PT services to increase functional independence and safety.   Follow Up Recommendations  Home health PT;Supervision/Assistance - 24 hour     Equipment Recommendations  Rolling walker with 5" wheels;3in1 (PT)    Recommendations for Other Services       Precautions / Restrictions Precautions Precautions: Fall Restrictions Weight Bearing Restrictions: Yes LLE Weight Bearing: Non weight bearing    Mobility  Bed Mobility Overal bed mobility: Modified Independent             General bed mobility comments: Increased time.  HOB flat and no use of bed rails.  Transfers Overall transfer level: Needs assistance Equipment used: Rolling walker (2 wheeled) Transfers: Sit to/from Stand Sit to Stand: Supervision         General transfer comment: Reminder of NWB status.  Supervision for safety.  Ambulation/Gait Ambulation/Gait assistance: Supervision Ambulation Distance (Feet): 80 Feet Assistive device: Rolling walker (2 wheeled) Gait Pattern/deviations: Step-to pattern;Antalgic   Gait velocity interpretation: Below normal speed for age/gender General Gait Details: Cues to not rush and ensure that she is steady prior to taking each step.     Stairs Stairs: Yes Stairs assistance: Min assist Stair Management: Two rails;Backwards;With walker Number of Stairs: 2 General stair comments: Pt uses RW to assist up to first step and then uses B rails to hop up to 2nd step.  Demonstration and VCs for technique.  Wheelchair Mobility    Modified Rankin  (Stroke Patients Only)       Balance Overall balance assessment: Needs assistance Sitting-balance support: No upper extremity supported;Feet supported Sitting balance-Leahy Scale: Good     Standing balance support: Bilateral upper extremity supported;During functional activity Standing balance-Leahy Scale: Poor                      Cognition Arousal/Alertness: Awake/alert Behavior During Therapy: WFL for tasks assessed/performed Overall Cognitive Status: Within Functional Limits for tasks assessed                      Exercises      General Comments        Pertinent Vitals/Pain Pain Assessment: 0-10 Pain Score: 4  Pain Location: L ankle when not elevated Pain Descriptors / Indicators: Throbbing Pain Intervention(s): Limited activity within patient's tolerance;Monitored during session;Repositioned    Home Living Family/patient expects to be discharged to:: Private residence Living Arrangements: Spouse/significant other Available Help at Discharge: Family;Available PRN/intermittently Type of Home: House Home Access: Stairs to enter Entrance Stairs-Rails: Right;Left;Can reach both Home Layout: Two level;1/2 bath on main level (pt can sleep on couch until ready to do stairs.) Home Equipment: Crutches      Prior Function Level of Independence: Independent with assistive device(s)      Comments: pt indicates using crutches since injuring L ankle, but admits that she was putting weight through LE and not well balanced.     PT Goals (current goals can now be found in the care plan section) Acute Rehab PT Goals Patient Stated Goal: Back to normal.  PT Goal Formulation: With patient Time For Goal Achievement: 04/17/15 Potential to Achieve Goals: Good Progress towards PT goals: Progressing toward goals    Frequency  Min 5X/week    PT Plan Current plan remains appropriate    Co-evaluation             End of Session Equipment Utilized During  Treatment: Gait belt Activity Tolerance: Patient tolerated treatment well;Patient limited by fatigue Patient left: in chair;with call bell/phone within reach;with family/visitor present     Time: 8657-8469 PT Time Calculation (min) (ACUTE ONLY): 13 min  Charges:  $Gait Training: 8-22 mins                    G Codes:  Functional Assessment Tool Used: Clinical Judgement Functional Limitation: Mobility: Walking and moving around Mobility: Walking and Moving Around Current Status (G2952): At least 1 percent but less than 20 percent impaired, limited or restricted Mobility: Walking and Moving Around Goal Status [id number]): 0 percent impaired, limited or restricted   Michail Jewels PT, DPT [phone] Pager: [phone] 04/10/2015, 4:42 PM

## 2015-04-10 NOTE — Progress Notes (Signed)
Pt feeling better ok for dc Did well with PT

## 2015-04-10 NOTE — Progress Notes (Signed)
I received call from pt. And she was in tears asking for pain medication at about 0800, I administered 1 mg prn Dilaudid at that time for breakthrough pain and advised her I would bring her Oxy IR after another 1 hour had passed since last time given was prior to midnight and since midnight. Pt. Received 10 mg Oxy IR at about 0934 and was rating her pain now down to an 8 from a 10. 30 minutes later, pt. Called out asking for her IV Dilaudid pain medication. I advised pt. I would bring it as soon as I could and I advised my charge nurse of the situation with the pt.'s pain. My charge nurse stepped in to help pt.

## 2015-04-10 NOTE — Care Management Note (Signed)
Case Management Note  Patient Details  Name: Eileen Landry MRN: [id number] Date of Birth: [birth year]  Subjective/Objective:          S/p left ankle ORIF          Action/Plan:   Spoke with patient about HH, she chose [facility]. Contacted Miranda at [facility] and set up HHPT. Contacted James with [facility] and requested rolling walker be delivered to patient's room. Patient declined 3N1.    Expected Discharge Date:                  Expected Discharge Plan:  Home w Home Health Services  In-House Referral:  NA  Discharge planning Services  CM Consult  Post Acute Care Choice:  Durable Medical Equipment, Home Health Choice offered to:  Patient  DME Arranged:  Walker rolling DME Agency:  [facility]  HH Arranged:  PT [facility] Agency:  [facility]  Status of Service:  Completed, signed off  Medicare Important Message Given:    Date Medicare IM Given:    Medicare IM give by:    Date Additional Medicare IM Given:    Additional Medicare Important Message give by:     If discussed at Long Length of Stay Meetings, dates discussed:    Additional Comments:  Monica Becton, RN 04/10/2015, 3:53 PM

## 2015-04-10 NOTE — Progress Notes (Signed)
Physical Therapy Treatment Patient Details Name: Eileen Landry MRN: [id number] DOB: 07-Jun-1991 Today's Date: 04/10/2015    History of Present Illness pt presents with L ankle fx s/p ORIF.    PT Comments    Pt mobility limited by pain and difficulties with balance.  Pt states she has crutches at home, but wasn't very steady on them and had to put weight on her L LE.  Pt continues to need cueing for NWBing with RW use.  Pt inquired about knee walker, so PT brought to pt's room to demonstrate.  At this time feel pt is not ready for knee scooter and would benefit from HHPT to work on balance and mobility.  Will continue to follow.    Follow Up Recommendations  Home health PT;Supervision/Assistance - 24 hour     Equipment Recommendations  Rolling walker with 5" wheels;3in1 (PT)    Recommendations for Other Services       Precautions / Restrictions Precautions Precautions: Fall Restrictions Weight Bearing Restrictions: Yes LLE Weight Bearing: Non weight bearing    Mobility  Bed Mobility Overal bed mobility: Modified Independent                Transfers Overall transfer level: Needs assistance Equipment used: Rolling walker (2 wheeled) Transfers: Sit to/from Stand Sit to Stand: Supervision         General transfer comment: cues for UE use.    Ambulation/Gait Ambulation/Gait assistance: Min guard Ambulation Distance (Feet): 50 Feet Assistive device: Rolling walker (2 wheeled) Gait Pattern/deviations: Step-to pattern     General Gait Details: cues for RW use and safety.  pt mildly unsteady with RW and needs cueing to maintain NWBing.     Stairs            Wheelchair Mobility    Modified Rankin (Stroke Patients Only)       Balance Overall balance assessment: Needs assistance Sitting-balance support: No upper extremity supported;Feet supported Sitting balance-Leahy Scale: Good     Standing balance support: Bilateral upper extremity  supported;During functional activity Standing balance-Leahy Scale: Poor                      Cognition Arousal/Alertness: Awake/alert Behavior During Therapy: WFL for tasks assessed/performed Overall Cognitive Status: Within Functional Limits for tasks assessed                      Exercises      General Comments        Pertinent Vitals/Pain Pain Assessment: 0-10 Pain Score: 8  Pain Location: L ankle during mobility Pain Descriptors / Indicators: Sore;Throbbing Pain Intervention(s): Monitored during session;Premedicated before session;Repositioned    Home Living Family/patient expects to be discharged to:: Private residence Living Arrangements: Spouse/significant other Available Help at Discharge: Family;Available PRN/intermittently Type of Home: House Home Access: Stairs to enter Entrance Stairs-Rails: Right;Left;Can reach both Home Layout: Two level;1/2 bath on main level (pt can sleep on couch until ready to do stairs.) Home Equipment: Crutches      Prior Function Level of Independence: Independent with assistive device(s)      Comments: pt indicates using crutches since injuring L ankle, but admits that she was putting weight through LE and not well balanced.     PT Goals (current goals can now be found in the care plan section) Acute Rehab PT Goals Patient Stated Goal: Back to normal. PT Goal Formulation: With patient Time For Goal Achievement: 04/17/15 Potential to Achieve Goals:  Good    Frequency  Min 5X/week    PT Plan      Co-evaluation             End of Session Equipment Utilized During Treatment: Gait belt Activity Tolerance: Patient limited by pain;Patient limited by fatigue Patient left: in bed;with call bell/phone within reach;with family/visitor present     Time: 1347-1416 PT Time Calculation (min) (ACUTE ONLY): 29 min  Charges:  $Gait Training: 8-22 mins                    G Codes:  Functional Assessment Tool  Used: Clinical Judgement Functional Limitation: Mobility: Walking and moving around Mobility: Walking and Moving Around Current Status [phone]): At least 1 percent but less than 20 percent impaired, limited or restricted Mobility: Walking and Moving Around Goal Status [phone]): 0 percent impaired, limited or restricted   Sunny Schlein, [place] [phone] 04/10/2015, 2:50 PM

## 2015-04-10 NOTE — Discharge Instructions (Signed)
Information on my medicine - XARELTO® (Rivaroxaban) ° °This medication education was reviewed with me or my healthcare representative as part of my discharge preparation.   °Why was Xarelto® prescribed for you? °Xarelto® was prescribed for you to reduce the risk of blood clots forming after orthopedic surgery. The medical term for these abnormal blood clots is venous thromboembolism (VTE). ° °What do you need to know about xarelto® ? °Take your Xarelto® 10 mg ONCE DAILY at the same time every day. °You may take it either with or without food. ° °If you have difficulty swallowing the tablet whole, you may crush it and mix in applesauce just prior to taking your dose. ° °Take Xarelto® exactly as prescribed by your doctor and DO NOT stop taking Xarelto® without talking to the doctor who prescribed the medication.  Stopping without other VTE prevention medication to take the place of Xarelto® may increase your risk of developing a clot. ° °After discharge, you should have regular check-up appointments with your healthcare provider that is prescribing your Xarelto®.   ° °What do you do if you miss a dose? °If you miss a dose, take it as soon as you remember on the same day then continue your regularly scheduled once daily regimen the next day. Do not take two doses of Xarelto® on the same day.  ° °Important Safety Information °A possible side effect of Xarelto® is bleeding. You should call your healthcare provider right away if you experience any of the following: °  Bleeding from an injury or your nose that does not stop. °  Unusual colored urine (red or dark brown) or unusual colored stools (red or black). °  Unusual bruising for unknown reasons. °  A serious fall or if you hit your head (even if there is no bleeding). ° °Some medicines may interact with Xarelto® and might increase your risk of bleeding while on Xarelto®. To help avoid this, consult your healthcare provider or pharmacist prior to using any new  prescription or non-prescription medications, including herbals, vitamins, non-steroidal anti-inflammatory drugs (NSAIDs) and supplements. ° °This website has more information on Xarelto®: www.xarelto.com. ° ° ° °

## 2015-05-01 NOTE — Discharge Summary (Signed)
Physician Discharge Summary  Patient ID: Eileen Landry MRN: [id number] DOB/AGE: [birth year] 26 y.o.  Admit date: 2015/04/16 Discharge date: 04/10/2015  Admission Diagnoses:  Active Problems:   Ankle fracture   Discharge Diagnoses:  Same  Surgeries: Procedure(s): OPEN REDUCTION INTERNAL FIXATION LEFT ANKLE FRACTURE, BIMALLEOLAR.  on 04-16-2015   Consultants:    Discharged Condition: Stable  Hospital Course: Eileen Landry is an 24 y.o. female who was admitted 04/16/2015 with a chief complaint of ankle pain, and found to have a diagnosis of ankle fracture.  They were brought to the operating room on 2015/04/16 and underwent the above named procedures.  She tolerated the procedure well and was transferred to home in good condition nwb in splint on xarelto.  Antibiotics given:  Anti-infectives    Start     Dose/Rate Route Frequency Ordered Stop   04/10/15 0200  clindamycin (CLEOCIN) IVPB 600 mg     600 mg 100 mL/hr over 30 Minutes Intravenous Every 6 hours 04/16/15 2344 04/10/15 1124   04/16/2015 2000  clindamycin (CLEOCIN) IVPB 900 mg     900 mg 100 mL/hr over 30 Minutes Intravenous STAT 04-16-15 1946 April 16, 2015 2018    .  Recent vital signs:  Filed Vitals:   04/10/15 1337  BP: 102/48  Pulse: 96  Temp: 98.7 F (37.1 C)  Resp: 16    Recent laboratory studies:  Results for orders placed or performed during the hospital encounter of April 16, 2015  CBC  Result Value Ref Range   WBC 11.7 (H) 4.0 - 10.5 K/uL   RBC 4.52 3.87 - 5.11 MIL/uL   Hemoglobin 13.5 12.0 - 15.0 g/dL   HCT 40.9 81.1 - 91.4 %   MCV 90.9 78.0 - 100.0 fL   MCH 29.9 26.0 - 34.0 pg   MCHC 32.8 30.0 - 36.0 g/dL   RDW 78.2 95.6 - 21.3 %   Platelets 270 150 - 400 K/uL  hCG, serum, qualitative  Result Value Ref Range   Preg, Serum NEGATIVE NEGATIVE    Discharge Medications:     Medication List    TAKE these medications        BESIVANCE 0.6 % Susp  Generic drug:  Besifloxacin HCl  INSTILL 1 DROP IN EACH  EYE 3 TIMES A DAY STARTING 2 DAYS PRIOR TO PROCEDURE & 7 DAYS AFTER     clonazePAM 1 MG tablet  Commonly known as:  KLONOPIN  TAKE 1/2 TO 1 TABLET EVERY 12 HOURS AS NEEDED FOR ANXIETY     methocarbamol 500 MG tablet  Commonly known as:  ROBAXIN  Take 500 mg by mouth every 8 (eight) hours as needed for muscle spasms.     multivitamin with minerals Tabs tablet  Take 1 tablet by mouth daily.     NEXPLANON 68 MG Impl implant  Generic drug:  etonogestrel  1 each by Subdermal route once. Implanted December 2013     oxyCODONE 15 MG immediate release tablet  Commonly known as:  ROXICODONE  Take 10 mg by mouth every 6 (six) hours as needed for pain.     prednisoLONE acetate 1 % ophthalmic suspension  Commonly known as:  PRED FORTE  INSTILL ONE (1) DROP IN EACH EYE 3 TIMES DAY FOR 7 DAYS POSTOPERATIVELY        Diagnostic Studies: Dg Ankle Complete Left  04/16/15   CLINICAL DATA:  ORIF of LEFT ankle fracture.  EXAM: DG C-ARM 61-120 MIN; LEFT ANKLE COMPLETE - 3+ VIEW  COMPARISON:  None.  FINDINGS: Bimalleolar fracture ORIF is present with fibular interfragmentary screw. The ankle mortise is congruent and has been restored. Five intraoperative fluoroscopic spot films of the LEFT ankle are submitted for interpretation.  IMPRESSION: Bimalleolar ORIF of the LEFT ankle.   Electronically Signed   By: Andreas NewportGeoffrey  Lamke M.D.   On: 04/09/2015 22:33   Dg Ankle Left Port  04/10/2015   CLINICAL DATA:  Status post internal fixation of left ankle fracture. Initial encounter.  EXAM: PORTABLE LEFT ANKLE - 2 VIEW  COMPARISON:  Left ankle intraoperative films performed earlier today at 7:59 p.m.  FINDINGS: A screw is noted along the medial malleolus, and a plate and screws are seen along the distal fibula, transfixing the corresponding fractures in grossly anatomic alignment. No new fractures are seen. The ankle mortise is difficult to fully assess given limitations in positioning. The soft tissues are not well  characterized due to the overlying splint.  IMPRESSION: Status post internal fixation of medial malleolar and distal fibular fractures in grossly anatomic alignment.   Electronically Signed   By: Roanna RaiderJeffery  Chang M.D.   On: 04/10/2015 02:38   Dg C-arm 61-120 Min  04/09/2015   CLINICAL DATA:  ORIF of LEFT ankle fracture.  EXAM: DG C-ARM 61-120 MIN; LEFT ANKLE COMPLETE - 3+ VIEW  COMPARISON:  None.  FINDINGS: Bimalleolar fracture ORIF is present with fibular interfragmentary screw. The ankle mortise is congruent and has been restored. Five intraoperative fluoroscopic spot films of the LEFT ankle are submitted for interpretation.  IMPRESSION: Bimalleolar ORIF of the LEFT ankle.   Electronically Signed   By: Andreas NewportGeoffrey  Lamke M.D.   On: 04/09/2015 22:33    Disposition: 01-Home or Self Care      Discharge Instructions    Call MD / Call 911    Complete by:  As directed   If you experience chest pain or shortness of breath, CALL 911 and be transported to the hospital emergency room.  If you develope a fever above 101 F, pus (white drainage) or increased drainage or redness at the wound, or calf pain, call your surgeon's office.     Call MD / Call 911    Complete by:  As directed   If you experience chest pain or shortness of breath, CALL 911 and be transported to the hospital emergency room.  If you develope a fever above 101 F, pus (white drainage) or increased drainage or redness at the wound, or calf pain, call your surgeon's office.     Constipation Prevention    Complete by:  As directed   Drink plenty of fluids.  Prune juice may be helpful.  You may use a stool softener, such as Colace (over the counter) 100 mg twice a day.  Use MiraLax (over the counter) for constipation as needed.     Constipation Prevention    Complete by:  As directed   Drink plenty of fluids.  Prune juice may be helpful.  You may use a stool softener, such as Colace (over the counter) 100 mg twice a day.  Use MiraLax (over the  counter) for constipation as needed.     Diet - low sodium heart healthy    Complete by:  As directed      Diet - low sodium heart healthy    Complete by:  As directed      Discharge instructions    Complete by:  As directed   Elevate leg Non weight bearing Begin xarelto tomorrow afternoon  Discharge instructions    Complete by:  As directed   Elevate leg Non weight bearing     Increase activity slowly as tolerated    Complete by:  As directed      Increase activity slowly as tolerated    Complete by:  As directed            Follow-up Information    Follow up with [facility].   Why:  They will contact you to schedule home therapy visits.   Contact information:   [address] [place] [address] [phone]        Signed: Cammy Copa 05/01/2015, 11:31 PM

## 2016-06-27 IMAGING — CR DG ANKLE PORT 2V*L*
2 series · 2 of 2 positions shown · non-contrast
Comparison: Left ankle intraoperative films performed earlier today
at [DATE] p.m.

CLINICAL DATA: Status post internal fixation of left ankle
fracture. Initial encounter.

EXAM:
PORTABLE LEFT ANKLE - 2 VIEW

[AP]
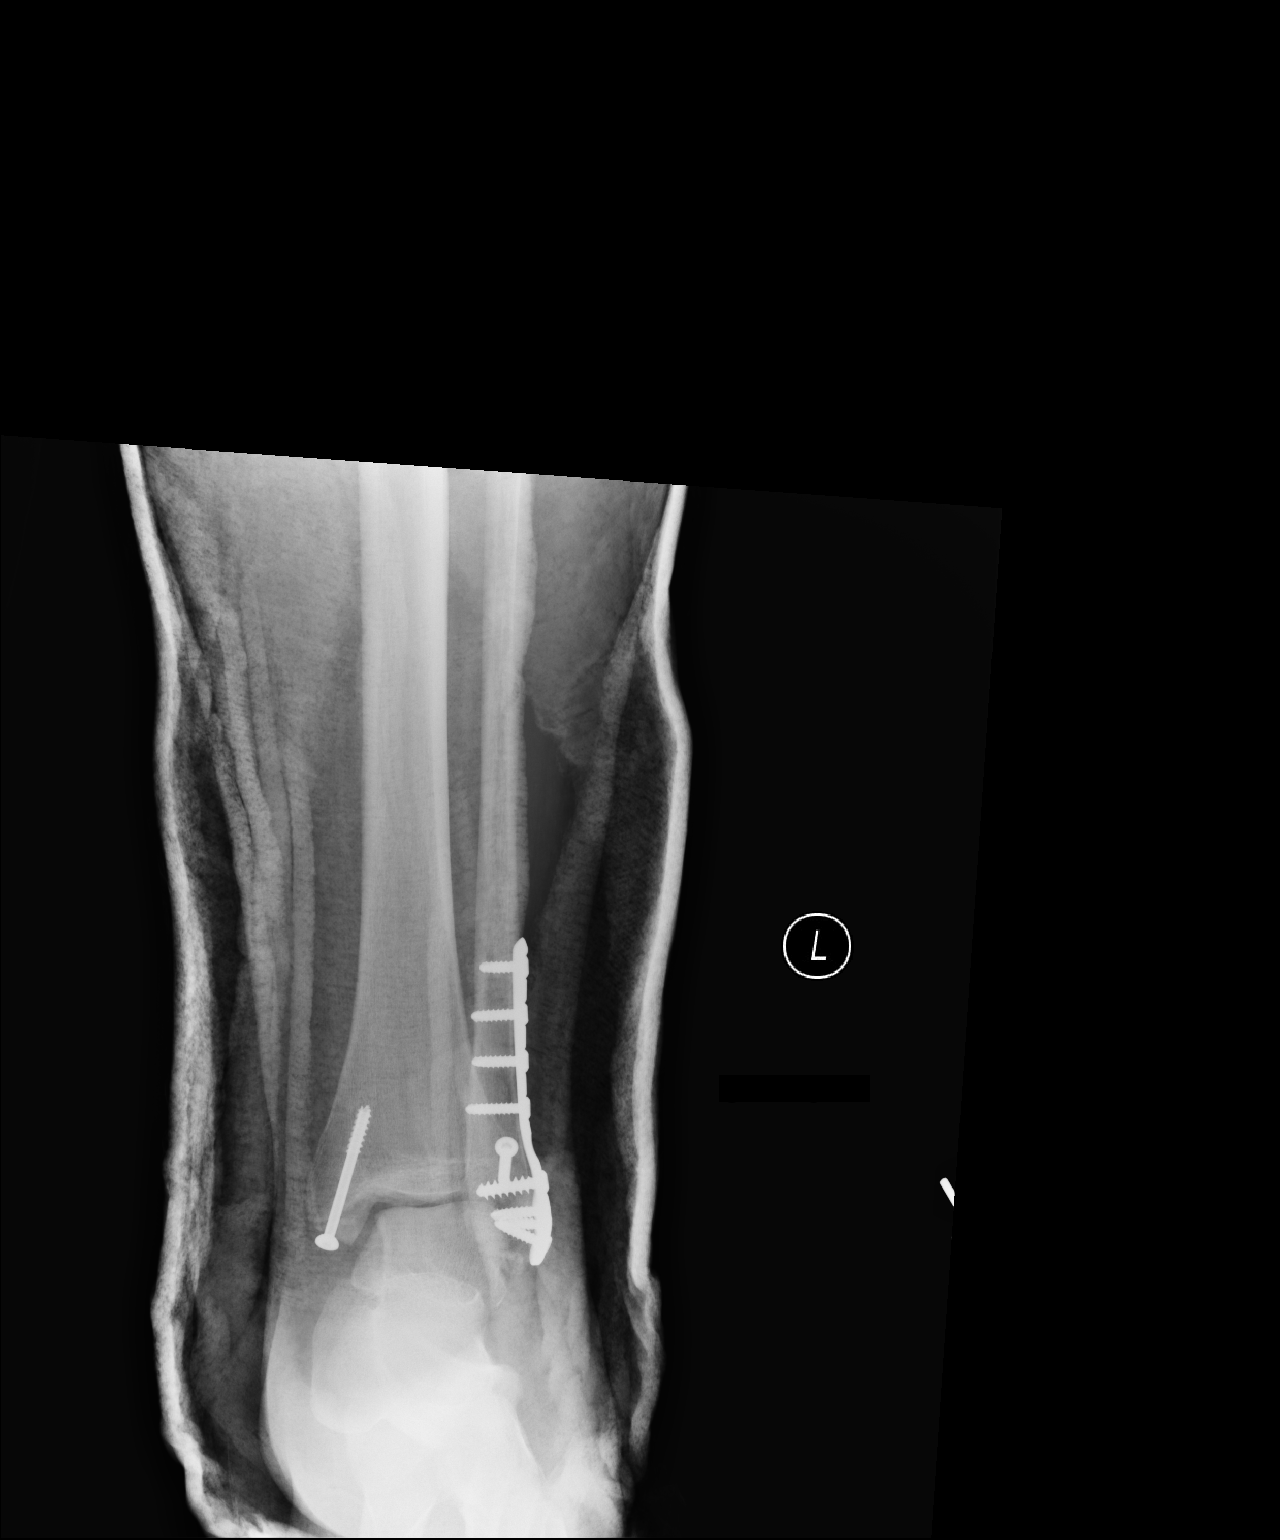

[lateral]
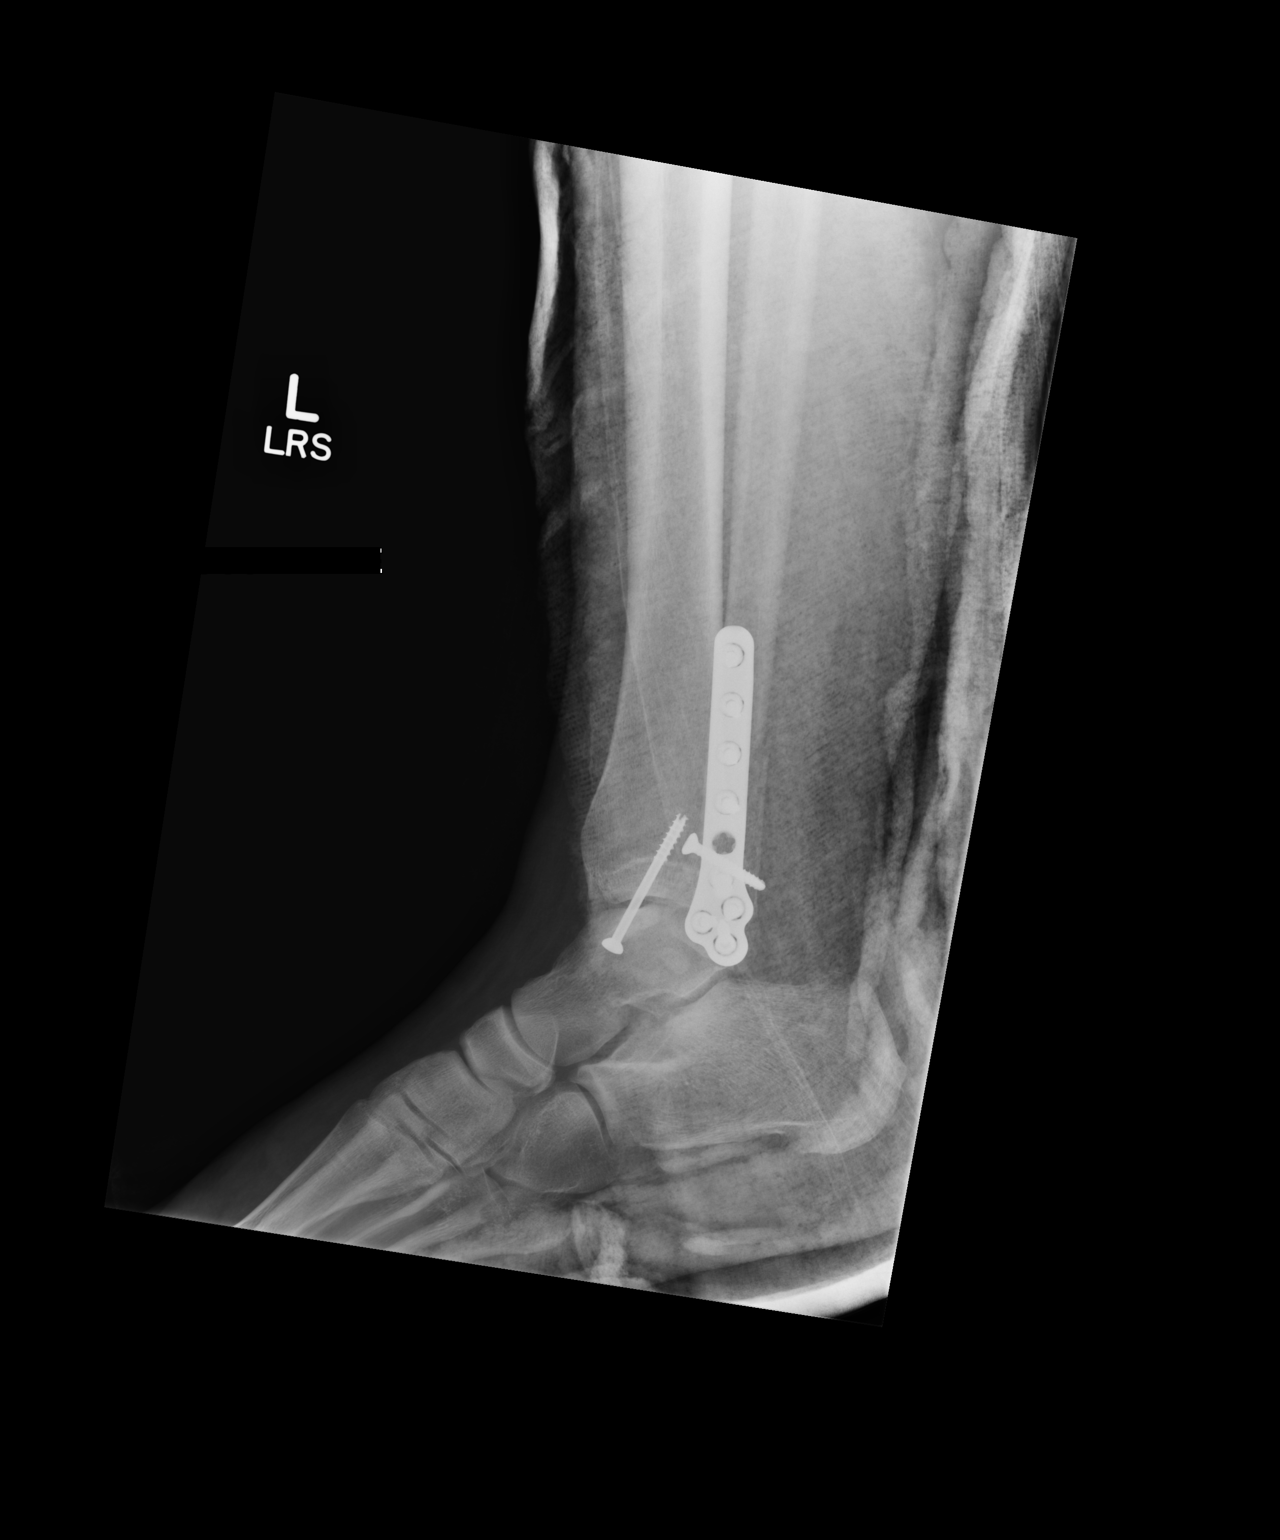

[2 of 2 positions shown; findings below may reference images not displayed]

FINDINGS: A screw is noted along the medial malleolus, and a plate and screws
are seen along the distal fibula, transfixing the corresponding
fractures in grossly anatomic alignment. No new fractures are seen.
The ankle mortise is difficult to fully assess given limitations in
positioning. The soft tissues are not well characterized due to the
overlying splint.
IMPRESSION: Status post internal fixation of medial malleolar and distal fibular
fractures in grossly anatomic alignment.

## 2016-06-27 IMAGING — RF DG ANKLE COMPLETE 3+V*L*
1 series · 5 of 5 positions shown · non-contrast
Comparison: None.

CLINICAL DATA: ORIF of LEFT ankle fracture.

EXAM:
DG C-ARM 61-120 MIN; LEFT ANKLE COMPLETE - 3+ VIEW

[Series 1: run · 5 of 5 slices shown]
[im 1/5]
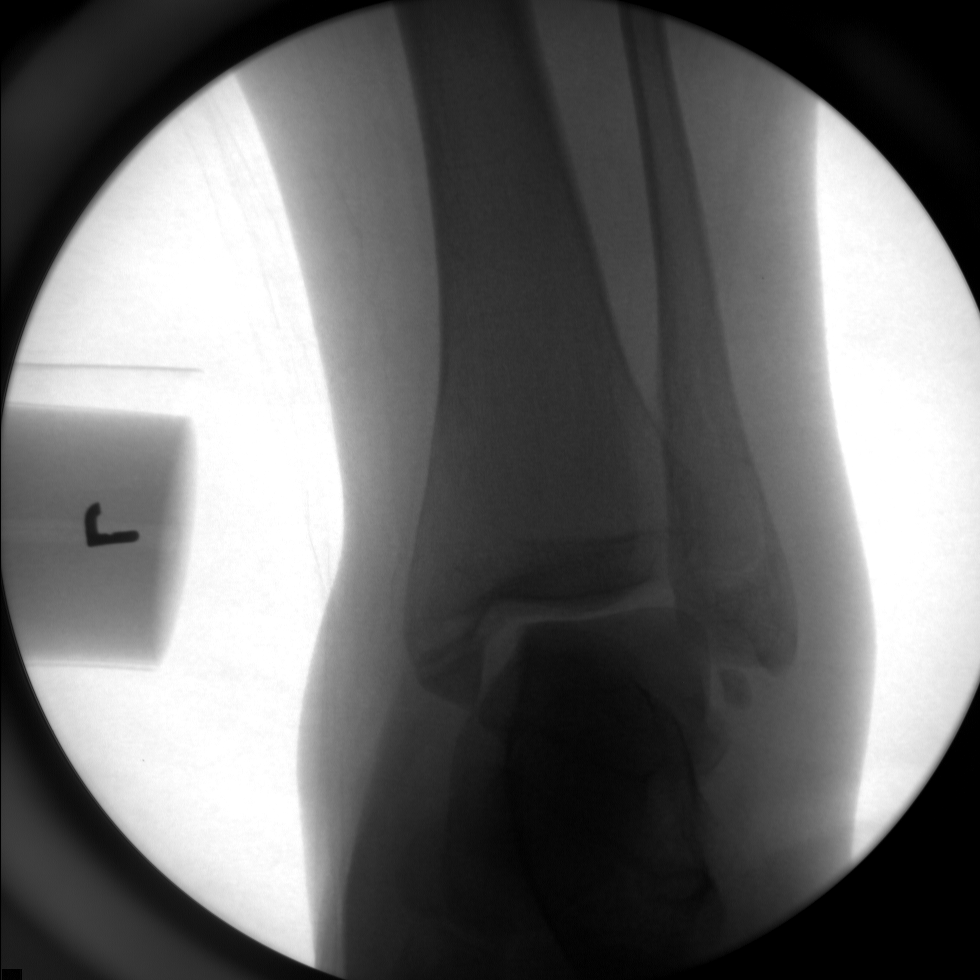
[im 2/5]
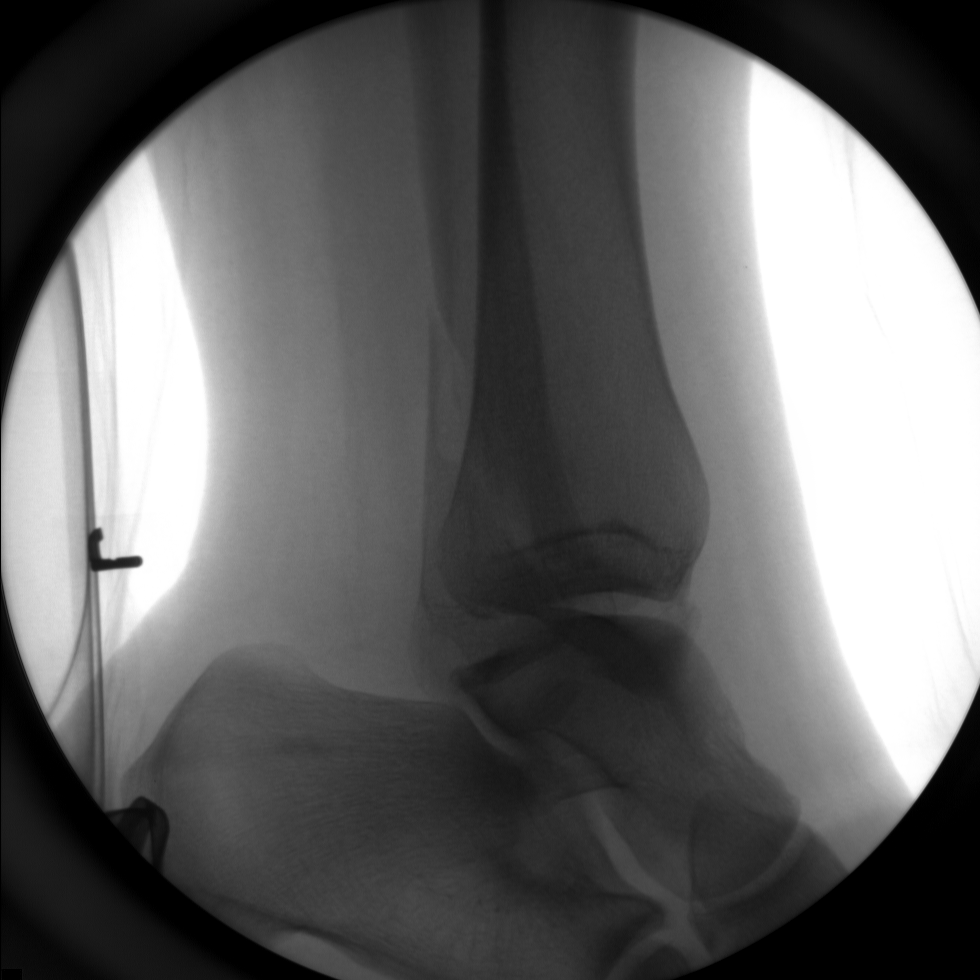
[im 3/5]
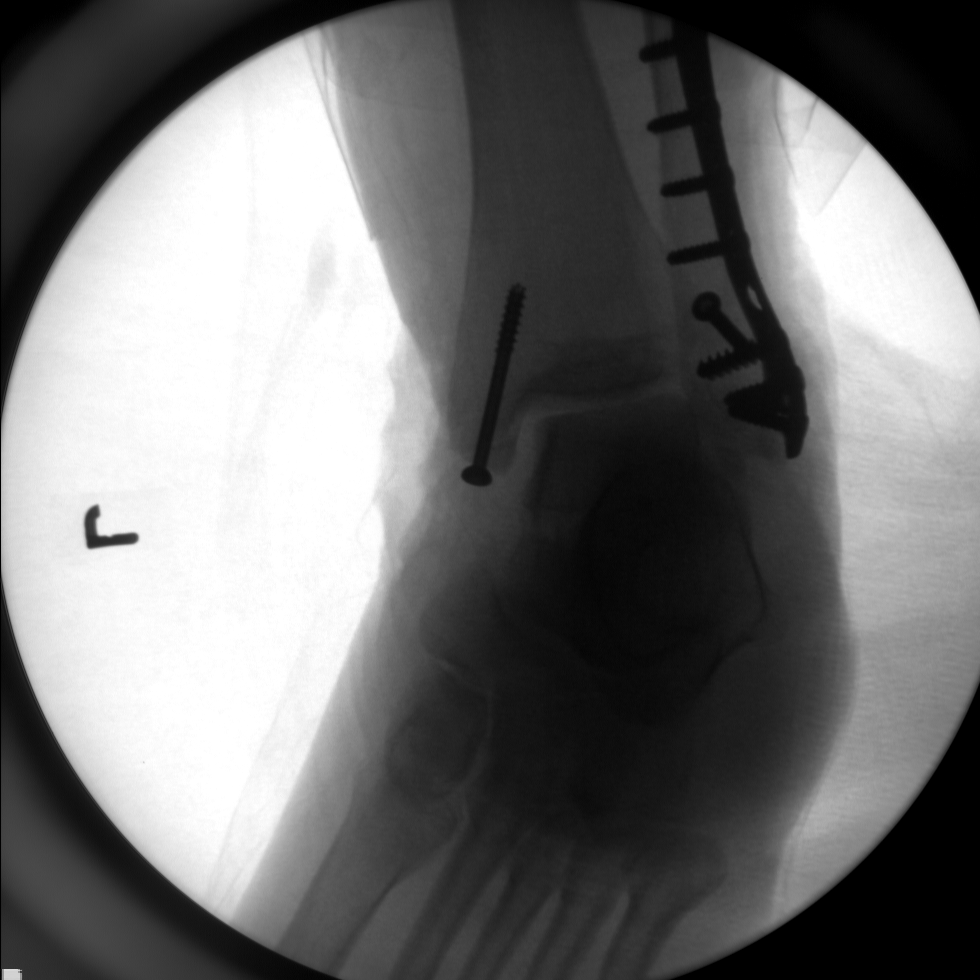
[im 4/5]
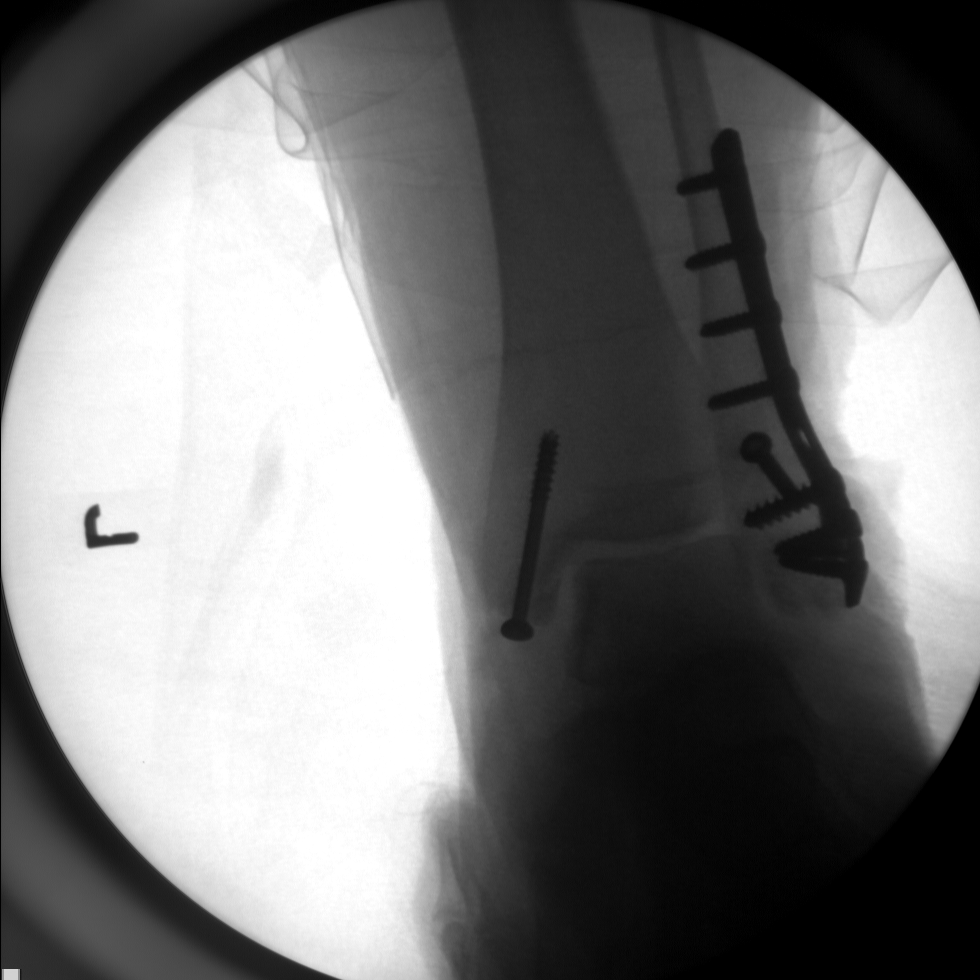
[im 5/5]
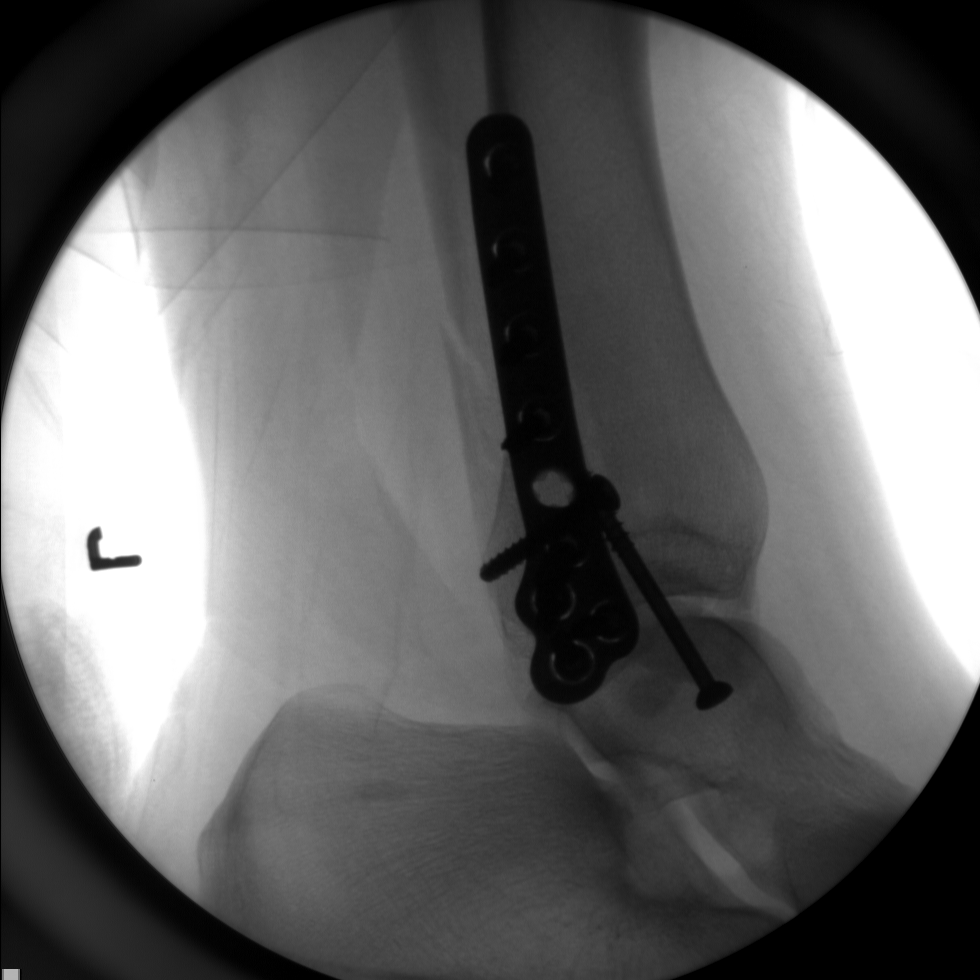

[5 of 5 positions shown; findings below may reference images not displayed]

FINDINGS: Bimalleolar fracture ORIF is present with fibular interfragmentary
screw. The ankle mortise is congruent and has been restored. Five
intraoperative fluoroscopic spot films of the LEFT ankle are
submitted for interpretation.
IMPRESSION: Bimalleolar ORIF of the LEFT ankle.

## 2016-08-26 ENCOUNTER — Other Ambulatory Visit: Payer: Self-pay | Admitting: Internal Medicine

## 2016-08-26 DIAGNOSIS — M545 Low back pain, unspecified: Secondary | ICD-10-CM

## 2016-08-27 ENCOUNTER — Encounter: Payer: Self-pay | Admitting: Radiology

## 2016-08-27 ENCOUNTER — Ambulatory Visit
Admission: RE | Admit: 2016-08-27 | Discharge: 2016-08-27 | Disposition: A | Discharge status: Home / Self Care | Payer: 59 | Source: Ambulatory Visit | Attending: Internal Medicine | Admitting: Internal Medicine

## 2016-08-27 DIAGNOSIS — M545 Low back pain, unspecified: Secondary | ICD-10-CM
